# Patient Record
Sex: Male | Born: 1980 | Race: White | Hispanic: No | Marital: Single | Smoking: Current every day smoker
Health system: Southern US, Community
[De-identification: ages and names within clinical notes are randomized; demographics above are authoritative.]

## PROBLEM LIST (undated history)

## (undated) DIAGNOSIS — F909 Attention-deficit hyperactivity disorder, unspecified type: Secondary | ICD-10-CM

---

## 2005-09-02 ENCOUNTER — Emergency Department (HOSPITAL_COMMUNITY): Admission: EM | Admit: 2005-09-02 | Discharge: 2005-09-02 | Payer: Self-pay | Admitting: Emergency Medicine

## 2006-10-19 ENCOUNTER — Emergency Department (HOSPITAL_COMMUNITY): Admission: EM | Admit: 2006-10-19 | Discharge: 2006-10-19 | Payer: Self-pay | Admitting: *Deleted

## 2007-12-24 ENCOUNTER — Encounter: Admission: RE | Admit: 2007-12-24 | Discharge: 2007-12-24 | Payer: Self-pay | Admitting: Gastroenterology

## 2010-10-05 ENCOUNTER — Other Ambulatory Visit: Payer: Self-pay | Admitting: Family Medicine

## 2010-10-05 DIAGNOSIS — R9389 Abnormal findings on diagnostic imaging of other specified body structures: Secondary | ICD-10-CM

## 2010-10-06 ENCOUNTER — Inpatient Hospital Stay: Admission: RE | Admit: 2010-10-06 | Payer: Self-pay | Source: Ambulatory Visit

## 2016-09-20 ENCOUNTER — Ambulatory Visit (INDEPENDENT_AMBULATORY_CARE_PROVIDER_SITE_OTHER): Payer: Self-pay | Admitting: Physician Assistant

## 2016-09-20 VITALS — BP 131/91 | HR 100 | Temp 98.5°F | Resp 18 | Wt 197.0 lb

## 2016-09-20 DIAGNOSIS — F900 Attention-deficit hyperactivity disorder, predominantly inattentive type: Secondary | ICD-10-CM

## 2016-09-20 DIAGNOSIS — F419 Anxiety disorder, unspecified: Secondary | ICD-10-CM

## 2016-09-20 MED ORDER — AMPHETAMINE-DEXTROAMPHET ER 30 MG PO CP24: 30.0000 mg | ORAL_CAPSULE | Freq: Two times a day (BID) | ORAL | 0 refills | Status: DC

## 2016-09-20 NOTE — Progress Notes (Signed)
   George BallerJeffrey Archer  MRN: [id number] DOB: 12/14/1980  PCP: No PCP Per Patient  Chief Complaint  Patient presents with  . Medication Refill    Adderall     Subjective:  Pt presents to clinic for medication evaluation.  He has been seeing Fr[facility].  He has been seeing Rolm Galarik for recent increase in anxiety which has been related to inattentiveness and lack of focus with a new diagnosis of ADHD.  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  He is currently not in school but he only has a few classes to finish but he cannot get his work completed.  In both school and his private life he has starts many tasks but he finishes few to none of then.  He has a long list of things that need to be done but that gets him overwhelmed which increased his anxiety and the results is nothing gets done and he is anxious.  He pays bills right on time or late.  Since his diagnosis he has used a friends Adderall XR which has helped his anxiety and he has been able to finish tasks.  It helps for about 6 hours and gives him no side effects.   Franchot ErichsenErik nelson - for anxiety and depression  Review of Systems  Psychiatric/Behavioral: Positive for decreased concentration. Negative for dysphoric mood, self-injury and sleep disturbance. The patient is nervous/anxious.     There are no active problems to display for this patient.   No current outpatient prescriptions on file prior to visit.   No current facility-administered medications on file prior to visit.     No Known Allergies  Pt patients past, family and social history were reviewed and updated.   Objective:  BP (!) 131/91   Pulse 100   Temp 98.5 F (36.9 C) (Oral)   Resp 18   Wt 197 lb (89.4 kg)   SpO2 99%   Physical Exam  Constitutional: He is oriented to person, place, and time and well-developed, well-nourished, and in no distress.  HENT:  Head: Normocephalic and atraumatic.  Right Ear: External ear normal.  Left  Ear: External ear normal.  Eyes: Conjunctivae are normal.  Neck: Normal range of motion.  Cardiovascular: Normal rate, regular rhythm and normal heart sounds.   No murmur heard. Pulmonary/Chest: Effort normal and breath sounds normal. He has no wheezes.  Neurological: He is alert and oriented to person, place, and time. Gait normal.  Skin: Skin is warm and dry.  Psychiatric: Mood, memory, affect and judgment normal.  Restless in the room - constantly moving    Assessment and Plan :   Problem List Items Addressed This Visit      Other   Attention deficit hyperactivity disorder (ADHD), predominantly inattentive type - Primary    New Diagnosis.  Start Adderall XR 30mg  as the patient has tried this and it helps with his symptoms.  Continue seeing Franchot Erichsenrik Nelson for continued therapy.      Relevant Medications   amphetamine-dextroamphetamine (ADDERALL XR) 30 MG 24 hr capsule    Other Visit Diagnoses    Anxiety    Results from untreated ADHD - improved with treatment - this should continue to improve        Benny LennertSarah Levora Werden PA-C  Primary Care at [facility] 09/20/2016 2:06 PM

## 2016-09-20 NOTE — Patient Instructions (Addendum)
mychart message me in a month - with your progress      IF you received an x-ray today, you will receive an invoice from [facility]. Please contact [facility] at [phone] with questions or concerns regarding your invoice.   IF you received labwork today, you will receive an invoice from [facility]. Please contact [facility] at [phone] with questions or concerns regarding your invoice.   Our billing staff will not be able to assist you with questions regarding bills from these companies.  You will be contacted with the lab results as soon as they are available. The fastest way to get your results is to activate your My Chart account. Instructions are located on the last page of this paperwork. If you have not heard from us regarding the results in 2 weeks, please contact this office.

## 2016-09-30 ENCOUNTER — Encounter: Payer: Self-pay | Admitting: Physician Assistant

## 2016-09-30 DIAGNOSIS — F900 Attention-deficit hyperactivity disorder, predominantly inattentive type: Secondary | ICD-10-CM | POA: Insufficient documentation

## 2016-09-30 NOTE — Assessment & Plan Note (Signed)
New Diagnosis.  Start Adderall XR  as the patient has tried this and it helps with his symptoms.  Continue seeing Franchot Erichsen for continued therapy.

## 2016-10-21 ENCOUNTER — Encounter: Payer: Self-pay | Admitting: Physician Assistant

## 2016-10-21 DIAGNOSIS — F900 Attention-deficit hyperactivity disorder, predominantly inattentive type: Secondary | ICD-10-CM

## 2016-10-23 MED ORDER — AMPHETAMINE-DEXTROAMPHET ER 30 MG PO CP24: 30.0000 mg | ORAL_CAPSULE | Freq: Two times a day (BID) | ORAL | 0 refills | Status: DC

## 2016-10-23 NOTE — Telephone Encounter (Signed)
Done - pt knows through my chart 

## 2016-11-22 ENCOUNTER — Encounter: Payer: Self-pay | Admitting: Physician Assistant

## 2016-11-22 DIAGNOSIS — F900 Attention-deficit hyperactivity disorder, predominantly inattentive type: Secondary | ICD-10-CM

## 2016-11-23 MED ORDER — AMPHETAMINE-DEXTROAMPHET ER 30 MG PO CP24: 30.0000 mg | ORAL_CAPSULE | Freq: Two times a day (BID) | ORAL | 0 refills | Status: DC

## 2016-11-23 NOTE — Telephone Encounter (Signed)
Done - pt knows through my chart 

## 2016-12-23 ENCOUNTER — Emergency Department (HOSPITAL_COMMUNITY): Payer: Self-pay

## 2016-12-23 ENCOUNTER — Emergency Department (HOSPITAL_COMMUNITY)
Admission: EM | Admit: 2016-12-23 | Discharge: 2016-12-23 | Disposition: A | Discharge status: Home / Self Care | Payer: Self-pay | Attending: Emergency Medicine | Admitting: Emergency Medicine

## 2016-12-23 ENCOUNTER — Encounter (HOSPITAL_COMMUNITY): Payer: Self-pay | Admitting: *Deleted

## 2016-12-23 DIAGNOSIS — S52501A Unspecified fracture of the lower end of right radius, initial encounter for closed fracture: Secondary | ICD-10-CM | POA: Insufficient documentation

## 2016-12-23 DIAGNOSIS — Y9352 Activity, horseback riding: Secondary | ICD-10-CM | POA: Insufficient documentation

## 2016-12-23 DIAGNOSIS — Y998 Other external cause status: Secondary | ICD-10-CM | POA: Insufficient documentation

## 2016-12-23 DIAGNOSIS — M25551 Pain in right hip: Secondary | ICD-10-CM

## 2016-12-23 DIAGNOSIS — F900 Attention-deficit hyperactivity disorder, predominantly inattentive type: Secondary | ICD-10-CM | POA: Insufficient documentation

## 2016-12-23 DIAGNOSIS — F17210 Nicotine dependence, cigarettes, uncomplicated: Secondary | ICD-10-CM | POA: Insufficient documentation

## 2016-12-23 DIAGNOSIS — S3991XA Unspecified injury of abdomen, initial encounter: Secondary | ICD-10-CM | POA: Insufficient documentation

## 2016-12-23 DIAGNOSIS — Y929 Unspecified place or not applicable: Secondary | ICD-10-CM | POA: Insufficient documentation

## 2016-12-23 HISTORY — DX: Attention-deficit hyperactivity disorder, unspecified type: F90.9

## 2016-12-23 LAB — I-STAT CHEM 8, ED
BUN: 16 mg/dL (ref 6–20)
CALCIUM ION: 1.13 mmol/L — AB (ref 1.15–1.40)
CHLORIDE: 105 mmol/L (ref 101–111)
CREATININE: 1.2 mg/dL (ref 0.61–1.24)
Glucose, Bld: 104 mg/dL — ABNORMAL HIGH (ref 65–99)
HCT: 44 % (ref 39.0–52.0)
Hemoglobin: 15 g/dL (ref 13.0–17.0)
Potassium: 3.9 mmol/L (ref 3.5–5.1)
Sodium: 143 mmol/L (ref 135–145)
TCO2: 27 mmol/L (ref 0–100)

## 2016-12-23 MED ORDER — METHOCARBAMOL 500 MG PO TABS: 500.0000 mg | ORAL_TABLET | Freq: Two times a day (BID) | ORAL | 0 refills | Status: AC

## 2016-12-23 MED ORDER — METHOCARBAMOL 500 MG PO TABS
500.0000 mg | ORAL_TABLET | Freq: Once | ORAL | Status: AC
  Administered 2016-12-23: 500 mg via ORAL
  Filled 2016-12-23: qty 1

## 2016-12-23 MED ORDER — OXYCODONE-ACETAMINOPHEN 5-325 MG PO TABS: 1.0000 | ORAL_TABLET | ORAL | 0 refills | Status: AC | PRN

## 2016-12-23 MED ORDER — IOPAMIDOL (ISOVUE-300) INJECTION 61%
INTRAVENOUS | Status: AC
  Administered 2016-12-23: 100 mL
  Filled 2016-12-23: qty 100

## 2016-12-23 MED ORDER — OXYCODONE-ACETAMINOPHEN 5-325 MG PO TABS
1.0000 | ORAL_TABLET | Freq: Once | ORAL | Status: AC
  Administered 2016-12-23: 1 via ORAL
  Filled 2016-12-23: qty 1

## 2016-12-23 MED ORDER — LIDOCAINE HCL 2 % IJ SOLN
20.0000 mL | Freq: Once | INTRAMUSCULAR | Status: AC
  Administered 2016-12-23: 400 mg
  Filled 2016-12-23: qty 20

## 2016-12-23 NOTE — ED Notes (Signed)
Pt now agreeing to go for CT scans.

## 2016-12-23 NOTE — ED Notes (Signed)
Hand surgeon at bedside. 

## 2016-12-23 NOTE — Progress Notes (Signed)
Orthopedic Tech Progress Note Patient Details:  George BallerJeffrey Archer 05/30/1981 [id number]  Ortho Devices Type of Ortho Device: Arm sling, Ace wrap, Sugartong splint Ortho Device/Splint Interventions: Application   Saul FordyceJennifer C Gloyd Happ 12/23/2016, 8:43 AM

## 2016-12-23 NOTE — ED Notes (Signed)
Patient transported to CT 

## 2016-12-23 NOTE — ED Notes (Signed)
fluoro machine placed at bedside per dr Carlos LeveringGramig's request

## 2016-12-23 NOTE — ED Notes (Signed)
ED Provider at bedside. 

## 2016-12-23 NOTE — ED Notes (Signed)
Patient brought back from CT due to refusing his scan. Pt states he feels fine and does not want the CT scan. This RN explained the importance of the CT scan to patient who still refused. DR. Manus Gunningancour made aware.

## 2016-12-23 NOTE — ED Provider Notes (Signed)
Blood pressure 119/79, pulse (!) 103, temperature 98 F (36.7 C), temperature source Oral, resp. rate 18, height 6\' 4"  (1.93 m), weight 83.9 kg (185 lb), SpO2 100 %.  Assuming care from Dr. Manus Gunningancour.  In short, George Archer is a 36 y.o. male with a chief complaint of Fall (bucked from a horse) .  Refer to the original H&P for additional details.  The current plan of care is to f/u after wrist reduction.  09:31 AM Dr. Amanda PeaGramig reduced the wrist fracture. The patient is complaining of severe back pain. No fractures on CT. Plan for PO pain medication and ambulation prior to discharge.   11:00 AM Patient given additional medication but is asking for discharge despite having difficulty with ambulation. Discussed return precautions in detail.   At this time, I do not feel there is any life-threatening condition present. I have reviewed and discussed all results (EKG, imaging, lab, urine as appropriate), exam findings with patient. I have reviewed nursing notes and appropriate previous records.  I feel the patient is safe to be discharged home without further emergent workup. Discussed usual and customary return precautions. Patient and family (if present) verbalize understanding and are comfortable with this plan.  Patient will follow-up with their primary care provider. If they do not have a primary care provider, information for follow-up has been provided to them. All questions have been answered.  George BeneJoshua Jomari Bartnik, MD   George Archer, George Paulson G, MD 12/24/16 1125

## 2016-12-23 NOTE — Consult Note (Signed)
Reason for Consult:fracture left wrist Referring Physician: ER staff  George BallerJeffrey Archer is an 36 y.o. male.  HPI: Patient presents for evaluation of his left wrist fracture. He has a comminuted interarticular distal radius fracture. He notes no prior injury recently but did have 2 breaks when he was a child he states.  He is alert and appropriate. He's had out, folic beverages last night. He's had a thorough workup by the ER staff including CT of the chest abdomen and pelvis.  I've been asked to see and treat his left upper extremity. He is sensate in the painful he has obvious deformity. He is here today with significant other.  On examination he is alert and oriented and painful as mentioned.  Past Medical History:  Diagnosis Date  . ADHD     History reviewed. No pertinent surgical history.  No family history on file.  Social History:  reports that he has been smoking Cigarettes.  He has been smoking about 0.50 packs per day. He has never used smokeless tobacco. He reports that he drinks alcohol. He reports that he does not use drugs.  Allergies: No Known Allergies  Medications: I have reviewed the patient's current medications.  Results for orders placed or performed during the hospital encounter of 12/23/16 (from the past 48 hour(s))  I-stat chem 8, ed     Status: Abnormal   Collection Time: 12/23/16  4:55 AM  Result Value Ref Range   Sodium 143 135 - 145 mmol/L   Potassium 3.9 3.5 - 5.1 mmol/L   Chloride 105 101 - 111 mmol/L   BUN 16 6 - 20 mg/dL   Creatinine, Ser 1.611.20 0.61 - 1.24 mg/dL   Glucose, Bld 096104 (H) 65 - 99 mg/dL   Calcium, Ion 0.451.13 (L) 1.15 - 1.40 mmol/L   TCO2 27 0 - 100 mmol/L   Hemoglobin 15.0 13.0 - 17.0 g/dL   HCT 40.944.0 81.139.0 - 91.452.0 %    Dg Wrist Complete Left  Result Date: 12/23/2016 CLINICAL DATA:  Thrown from horse and CT. EXAM: LEFT WRIST - COMPLETE 3+ VIEW COMPARISON:  None. FINDINGS: There is a comminuted intra-articular distal radius fracture with  impaction and angulation. Ulnar styloid fracture. No dislocation. IMPRESSION: Fractures of the distal radius and ulnar styloid. Electronically Signed   By: Ellery Plunkaniel R Mitchell M.D.   On: 12/23/2016 04:55   Ct Head Wo Contrast  Result Date: 12/23/2016 CLINICAL DATA:  Larey SeatFell formal course with subsequent trauma to the right side and right hip. EXAM: CT HEAD WITHOUT CONTRAST TECHNIQUE: Contiguous axial images were obtained from the base of the skull through the vertex without intravenous contrast. COMPARISON:  None. FINDINGS: Brain: No evidence of malformation, atrophy, old or acute small or large vessel infarction, mass lesion, hemorrhage, hydrocephalus or extra-axial collection. No evidence of pituitary lesion. Vascular: No vascular calcification.  No hyperdense vessels. Skull: Normal.  No fracture or focal bone lesion. Sinuses/Orbits: Visualized sinuses are clear. No fluid in the middle ears or mastoids. Visualized orbits are normal. Other: None significant IMPRESSION: Normal head CT Electronically Signed   By: Paulina FusiMark  Shogry M.D.   On: 12/23/2016 07:08   Ct Cervical Spine Wo Contrast  Result Date: 12/23/2016 CLINICAL DATA:  Larey SeatFell from horse.  Trauma to the head and neck EXAM: CT CERVICAL SPINE WITHOUT CONTRAST TECHNIQUE: Multidetector CT imaging of the cervical spine was performed without intravenous contrast. Multiplanar CT image reconstructions were also generated. COMPARISON:  None. FINDINGS: Alignment: Normal Skull base and vertebrae: Normal Soft  tissues and spinal canal: Normal Disc levels: Normal except for very minimal uncovertebral hypertrophy at C3-4 and C4-5. Upper chest: Normal Other: None IMPRESSION: Normal CT scan cervical spine Electronically Signed   By: Paulina Fusi M.D.   On: 12/23/2016 07:12   Ct Abdomen Pelvis W Contrast  Result Date: 12/23/2016 CLINICAL DATA:  Larey Seat formal course. Subsequent trauma to the right abdomen and hip. EXAM: CT ABDOMEN AND PELVIS WITH CONTRAST TECHNIQUE: Multidetector  CT imaging of the abdomen and pelvis was performed using the standard protocol following bolus administration of intravenous contrast. CONTRAST:  ISOVUE-300 IOPAMIDOL (ISOVUE-300) INJECTION 61% COMPARISON:  Plain radiography same day FINDINGS: Lower chest: Normal Hepatobiliary: Normal Pancreas: Normal Spleen: Normal Adrenals/Urinary Tract: Adrenal glands are normal. Kidneys are normal. No evidence of bladder injury. Stomach/Bowel: Normal Vascular/Lymphatic: Normal Reproductive: Normal Other: No free fluid or air. Musculoskeletal: No evidence fracture or dislocation. Specific attention to the pelvis and right hip does not show any evidence of injury. IMPRESSION: Normal CT scan of the abdomen and pelvis. No traumatic finding. No cause of right hip region pain identified. Electronically Signed   By: Paulina Fusi M.D.   On: 12/23/2016 07:17   Dg Hip Unilat  With Pelvis 2-3 Views Right  Result Date: 12/23/2016 CLINICAL DATA:  Thrown from horse and kicked. EXAM: DG HIP (WITH OR WITHOUT PELVIS) 2-3V RIGHT COMPARISON:  None. FINDINGS: There is no evidence of hip fracture or dislocation. There is no evidence of arthropathy or other focal bone abnormality. IMPRESSION: Negative. Electronically Signed   By: Ellery Plunk M.D.   On: 12/23/2016 04:54    Review of Systems  Respiratory: Negative.   Cardiovascular: Negative.   Gastrointestinal: Negative.   Psychiatric/Behavioral: The patient is nervous/anxious.    Blood pressure 119/79, pulse (!) 103, temperature 98 F (36.7 C), temperature source Oral, resp. rate 18, height 6\' 4"  (1.93 m), weight 83.9 kg (185 lb), SpO2 100 %. Physical Exam Right upper extremity has IV access and is neurovascularly intact. Left upper extremity has a normal elbow shoulder and upper arm he has obvious deformity and pain with distal radius fracture about the left wrist. He is sensate.  There is no signs of compartment syndrome. X-rays are reviewed which show a comminuted  distal radius fracture is noted.  He complains of back pain. He moves all extremities. Chest appears to be clear and his abdomen is flat and not particularly tender.  Once again I reviewed his medical records chart and his findings. In regards to his left upper extremity I would recommend close reduction and he consents. Assessment/Plan: Comminuted left distal radius fracture.  I've recommended close reduction under hematoma block and he consents.  Patient was given a hematoma block with 15 mL of lidocaine without epinephrine. Following this we then performed close reduction of the left distal radius fracture. AP lateral and oblique views 4 were used to assess the reduction and look quite well.  I have recommended definitive operative fixation given the comminution and his parameters. Certainly we're able to give him a good-looking wrist today in terms of his radiographs however I feel he will have definitive progressive angulatory collapse and this will ultimately lead to a malunion to some degree. Thus I would recommend open reduction internal fixation.  I discussed all issues with the patient at length.  I would recommend follow-up in my office in 3-5 days elevate move massage fingers and notify should any problems occur. Tolerated the procedure quite well today and there no  complicating features.  Tracey Hermance III,Raeanne Deschler M 12/23/2016, 8:02 AM

## 2016-12-23 NOTE — ED Triage Notes (Signed)
Pt was intoxicated when he took a dare to jump on a horse.  Pt was then thrown from the horse and feels that he was struck by the hoof of the horse.  Pt denies any LOC, he reports pain in left wrist and right hip/buttock.  Pt is alert and oriented.  Obvious deformity of left wrist.

## 2016-12-23 NOTE — ED Notes (Signed)
Patient transported to X-ray 

## 2016-12-23 NOTE — Discharge Instructions (Addendum)
You were seen in the ED today after falling from a horse. You will need to see the orthopedic surgeon next week. Do not drive while taking these pain medication.   Return to the ED with any new or worsening pain.

## 2016-12-23 NOTE — ED Notes (Signed)
Patient requesting IV be taken out

## 2016-12-23 NOTE — ED Notes (Signed)
Pt stating he does not want to go for CT scans, pt states he does not need them and that he is "fine." Dr. Manus Gunningancour at bedside to explain importance of exam to patient who agreed to go for CT scan.

## 2016-12-23 NOTE — ED Notes (Signed)
Ortho tech called 

## 2016-12-23 NOTE — ED Notes (Addendum)
Attempted to ambulate patient. Patient unable to bear weight on right leg. EDP made aware

## 2016-12-23 NOTE — ED Provider Notes (Signed)
[facility]EMER[facility]ENCY DEPT Provider Note   CSN: [id number] Arrival date & time: 12/23/16  4098   By signing my name below, I, Clarisse Gouge, attest that this documentation has been prepared under the direction and in the presence of Biagio Snelson, Jeannett Senior, MD. Electronically signed, Clarisse Gouge, ED Scribe. 12/23/16. 4:05 AM.  History   Chief Complaint Chief Complaint  Patient presents with  . Fall    bucked from a horse   LEVEL 5 CAVEAT: HPI and ROS limited due to intoxication   The history is provided by the patient and medical records. The history is limited by the condition of the patient. No language interpreter was used.    George Archer is a 36 y.o. male with h/o ADHD presenting to the Emergency Department concerning a multiple arthralgias s/p fall that occurred 1.5 hours prior to evaluation. Pt states he was bucked by a horse after trying to jump on it and landed on his L wrist. L wrist deformity noted; he also notes posterior R hip and bilateral knee pains. He thinks he may have been kicked by the horse on the aforementioned hip and R flank. Abrasion noted to R hip. 10/10 No head trauma/ LOC noted. ETOH on board. No illicit drug use. No headache, neck pain, abdominal pain or back pains noted. Last tetanus unknown.  Past Medical History:  Diagnosis Date  . ADHD     Patient Active Problem List   Diagnosis Date Noted  . Attention deficit hyperactivity disorder (ADHD), predominantly inattentive type 09/30/2016    History reviewed. No pertinent surgical history.     Home Medications    Prior to Admission medications   Medication Sig Start Date End Date Taking? Authorizing Provider  amphetamine-dextroamphetamine (ADDERALL XR) 30 MG 24 hr capsule Take 1 capsule (30 mg total) by mouth 2 (two) times daily. 11/23/16   Valarie Cones, Dema Severin, PA-C    Family History No family history on file.  Social History Social History  Substance Use Topics  . Smoking status: Current Every Day Smoker      Packs/day: 0.50    Types: Cigarettes  . Smokeless tobacco: Never Used  . Alcohol use Yes     Comment: 12 beers a week     Allergies   Patient has no known allergies.   Review of Systems Review of Systems  Unable to perform ROS: Other     Physical Exam Updated Vital Signs BP 125/70 (BP Location: Right Arm)   Pulse 85   Temp 98 F (36.7 C) (Oral)   Resp 16   Ht 6\' 4"  (1.93 m)   Wt 185 lb (83.9 kg)   SpO2 100%   BMI 22.52 kg/m   Physical Exam  Constitutional: He is oriented to person, place, and time. He appears well-developed and well-nourished. No distress.  intoxicated  HENT:  Head: Normocephalic and atraumatic.  Mouth/Throat: Oropharynx is clear and moist. No oropharyngeal exudate.  Eyes: Conjunctivae and EOM are normal. Pupils are equal, round, and reactive to light.  Neck: Normal range of motion. Neck supple.  No meningismus.  Cardiovascular: Normal rate, regular rhythm, normal heart sounds and intact distal pulses.   No murmur heard. Pulmonary/Chest: Effort normal and breath sounds normal. No respiratory distress.  Abdominal: Soft. There is no tenderness. There is no rebound and no guarding.  Musculoskeletal: Normal range of motion. He exhibits tenderness and deformity. He exhibits no edema.  Deformity L wrist. Radial pulses intact. Cardinal hand movement intact. No C, T or L spine  tenderness.  Neurological: He is alert and oriented to person, place, and time. No cranial nerve deficit. He exhibits normal muscle tone. Coordination normal.   5/5 strength throughout. CN 2-12 intact.Equal grip strength.   Skin: Skin is warm. Abrasion noted.  Abrasion R flank/ hip  Psychiatric: He has a normal mood and affect. His behavior is normal.  Nursing note and vitals reviewed.    ED Treatments / Results  DIAGNOSTIC STUDIES: Oxygen Saturation is 100% on RA, NL by my interpretation.    COORDINATION OF CARE: 3:59 AM-Discussed next steps with pt. Pt verbalized  understanding and is agreeable with the plan. Will order XR of L wrist.   Labs (all labs ordered are listed, but only abnormal results are displayed) Labs Reviewed  I-STAT CHEM 8, ED - Abnormal; Notable for the following:       Result Value   Glucose, Bld 104 (*)    Calcium, Ion 1.13 (*)    All other components within normal limits    EKG  EKG Interpretation None       Radiology Dg Wrist Complete Left  Result Date: 12/23/2016 CLINICAL DATA:  Thrown from horse and CT. EXAM: LEFT WRIST - COMPLETE 3+ VIEW COMPARISON:  None. FINDINGS: There is a comminuted intra-articular distal radius fracture with impaction and angulation. Ulnar styloid fracture. No dislocation. IMPRESSION: Fractures of the distal radius and ulnar styloid. Electronically Signed   By: Ellery Plunkaniel R Mitchell M.D.   On: 12/23/2016 04:55   Ct Head Wo Contrast  Result Date: 12/23/2016 CLINICAL DATA:  Larey SeatFell formal course with subsequent trauma to the right side and right hip. EXAM: CT HEAD WITHOUT CONTRAST TECHNIQUE: Contiguous axial images were obtained from the base of the skull through the vertex without intravenous contrast. COMPARISON:  None. FINDINGS: Brain: No evidence of malformation, atrophy, old or acute small or large vessel infarction, mass lesion, hemorrhage, hydrocephalus or extra-axial collection. No evidence of pituitary lesion. Vascular: No vascular calcification.  No hyperdense vessels. Skull: Normal.  No fracture or focal bone lesion. Sinuses/Orbits: Visualized sinuses are clear. No fluid in the middle ears or mastoids. Visualized orbits are normal. Other: None significant IMPRESSION: Normal head CT Electronically Signed   By: Paulina FusiMark  Shogry M.D.   On: 12/23/2016 07:08   Ct Cervical Spine Wo Contrast  Result Date: 12/23/2016 CLINICAL DATA:  Larey SeatFell from horse.  Trauma to the head and neck EXAM: CT CERVICAL SPINE WITHOUT CONTRAST TECHNIQUE: Multidetector CT imaging of the cervical spine was performed without intravenous  contrast. Multiplanar CT image reconstructions were also generated. COMPARISON:  None. FINDINGS: Alignment: Normal Skull base and vertebrae: Normal Soft tissues and spinal canal: Normal Disc levels: Normal except for very minimal uncovertebral hypertrophy at C3-4 and C4-5. Upper chest: Normal Other: None IMPRESSION: Normal CT scan cervical spine Electronically Signed   By: Paulina FusiMark  Shogry M.D.   On: 12/23/2016 07:12   Ct Abdomen Pelvis W Contrast  Result Date: 12/23/2016 CLINICAL DATA:  Larey SeatFell formal course. Subsequent trauma to the right abdomen and hip. EXAM: CT ABDOMEN AND PELVIS WITH CONTRAST TECHNIQUE: Multidetector CT imaging of the abdomen and pelvis was performed using the standard protocol following bolus administration of intravenous contrast. CONTRAST:  100mL ISOVUE-300 IOPAMIDOL (ISOVUE-300) INJECTION 61% COMPARISON:  Plain radiography same day FINDINGS: Lower chest: Normal Hepatobiliary: Normal Pancreas: Normal Spleen: Normal Adrenals/Urinary Tract: Adrenal glands are normal. Kidneys are normal. No evidence of bladder injury. Stomach/Bowel: Normal Vascular/Lymphatic: Normal Reproductive: Normal Other: No free fluid or air. Musculoskeletal: No evidence fracture  or dislocation. Specific attention to the pelvis and right hip does not show any evidence of injury. IMPRESSION: Normal CT scan of the abdomen and pelvis. No traumatic finding. No cause of right hip region pain identified. Electronically Signed   By: Paulina Fusi M.D.   On: 12/23/2016 07:17   Dg Hip Unilat  With Pelvis 2-3 Views Right  Result Date: 12/23/2016 CLINICAL DATA:  Thrown from horse and kicked. EXAM: DG HIP (WITH OR WITHOUT PELVIS) 2-3V RIGHT COMPARISON:  None. FINDINGS: There is no evidence of hip fracture or dislocation. There is no evidence of arthropathy or other focal bone abnormality. IMPRESSION: Negative. Electronically Signed   By: Ellery Plunk M.D.   On: 12/23/2016 04:54    Procedures Procedures (including critical  care time)  Medications Ordered in ED Medications - No data to display   Initial Impression / Assessment and Plan / ED Course  I have reviewed the triage vital signs and the nursing notes.  Pertinent labs & imaging results that were available during my care of the patient were reviewed by me and considered in my medical decision making (see chart for details).    Level 5 caveat for alcohol intoxication. Patient jumped onto a horse and was thrown off landing on his left wrist and right hip. Denies hitting head or losing consciousness.  Deformity to left wrist with pulse intact. No open wounds.  CT head obtained given intoxication and C a/p obtained given flank abrasion.  CTs negative. Patient refuses tetanus. Distal radius fracture and ulnar styloid fracture confirmed on Xray.  D/w Dr. Amanda Pea who will see in the ED and reduce and splint.  Dr. Jacqulyn Bath aware of patient at shift change.  Final Clinical Impressions(s) / ED Diagnoses   Final diagnoses:  Closed fracture of distal end of right radius, unspecified fracture morphology, initial encounter  Fall from horse, initial encounter    New Prescriptions New Prescriptions   No medications on file   I personally performed the services described in this documentation, which was scribed in my presence. The recorded information has been reviewed and is accurate.    Glynn Octave, MD 12/23/16 [phone]

## 2016-12-25 ENCOUNTER — Ambulatory Visit: Payer: Self-pay | Admitting: Physician Assistant

## 2016-12-25 ENCOUNTER — Encounter: Payer: Self-pay | Admitting: Physician Assistant

## 2016-12-28 ENCOUNTER — Ambulatory Visit (INDEPENDENT_AMBULATORY_CARE_PROVIDER_SITE_OTHER): Payer: Self-pay | Admitting: Physician Assistant

## 2016-12-28 ENCOUNTER — Encounter: Payer: Self-pay | Admitting: Physician Assistant

## 2016-12-28 DIAGNOSIS — F900 Attention-deficit hyperactivity disorder, predominantly inattentive type: Secondary | ICD-10-CM

## 2016-12-28 MED ORDER — AMPHETAMINE-DEXTROAMPHET ER 30 MG PO CP24: 30.0000 mg | ORAL_CAPSULE | ORAL | 0 refills | Status: DC

## 2016-12-28 MED ORDER — AMPHETAMINE-DEXTROAMPHET ER 30 MG PO CP24: 30.0000 mg | ORAL_CAPSULE | Freq: Two times a day (BID) | ORAL | 0 refills | Status: DC

## 2016-12-28 MED ORDER — AMPHETAMINE-DEXTROAMPHET ER 30 MG PO CP24
30.0000 mg | ORAL_CAPSULE | ORAL | 0 refills | Status: DC
Start: 2016-12-28 — End: 2017-01-29

## 2016-12-28 NOTE — Patient Instructions (Signed)
     IF you received an x-ray today, you will receive an invoice from [facility]. Please contact [facility] at [phone] with questions or concerns regarding your invoice.   IF you received labwork today, you will receive an invoice from [facility]. Please contact [facility] at [phone] with questions or concerns regarding your invoice.   Our billing staff will not be able to assist you with questions regarding bills from these companies.  You will be contacted with the lab results as soon as they are available. The fastest way to get your results is to activate your My Chart account. Instructions are located on the last page of this paperwork. If you have not heard from us regarding the results in 2 weeks, please contact this office.     

## 2016-12-28 NOTE — Progress Notes (Signed)
   Sonia BallerJeffrey Rozar  MRN: [id number] DOB: 10/10/1980  PCP: Morrell RiddleWeber, Ariane Ditullio L, PA-C  Chief Complaint  Patient presents with  . Medication Refill    Adderall    Subjective:  Pt presents to clinic for medication refills.  He is doing so much better since the start of his medications.  His anxiety is almost gone.  Using Adderall - wakes around noon - next pills 6-7pm prior to work --   Still seeing Rolm Galarik for therapy  Recently broke his arm and he thinks he will likely have to have surgery - he has f/u with ortho scheduled for next week.  Review of Systems  Constitutional: Negative for appetite change, chills, fever and unexpected weight change.  Respiratory: Negative for cough.   Cardiovascular: Negative for chest pain and leg swelling.  Psychiatric/Behavioral: Negative for sleep disturbance.    Patient Active Problem List   Diagnosis Date Noted  . Attention deficit hyperactivity disorder (ADHD), predominantly inattentive type 09/30/2016    Current Outpatient Prescriptions on File Prior to Visit  Medication Sig Dispense Refill  . methocarbamol (ROBAXIN) 500 MG tablet Take 1 tablet (500 mg total) by mouth 2 (two) times daily. 20 tablet 0  . oxyCODONE-acetaminophen (PERCOCET/ROXICET) 5-325 MG tablet Take 1 tablet by mouth every 4 (four) hours as needed for severe pain. 20 tablet 0   No current facility-administered medications on file prior to visit.     No Known Allergies  Pt patients past, family and social history were reviewed and updated.   Objective:  BP 101/62 (BP Location: Right Arm, Patient Position: Sitting, Cuff Size: Normal)   Pulse 93   Temp (!) 97.4 F (36.3 C) (Oral)   Resp 18   Ht 6' 4.06" (1.932 m)   Wt 190 lb 12.8 oz (86.5 kg)   SpO2 98%   BMI 23.19 kg/m   Physical Exam  Constitutional: He is oriented to person, place, and time and well-developed, well-nourished, and in no distress.  HENT:  Head: Normocephalic and atraumatic.  Right Ear: External ear  normal.  Left Ear: External ear normal.  Eyes: Conjunctivae are normal.  Neck: Normal range of motion.  Pulmonary/Chest: Effort normal.  Neurological: He is alert and oriented to person, place, and time. Gait normal.  Skin: Skin is warm and dry.  Psychiatric: Mood, memory, affect and judgment normal.    Assessment and Plan :  Attention deficit hyperactivity disorder (ADHD), predominantly inattentive type - Plan: amphetamine-dextroamphetamine (ADDERALL XR) 30 MG 24 hr capsule, amphetamine-dextroamphetamine (ADDERALL XR) 30 MG 24 hr capsule, amphetamine-dextroamphetamine (ADDERALL XR) 30 MG 24 hr capsule   Wrote Rx for patient - gave him a 3 months supply - he understands that it is his responsibility that if he loses them.  Contact me in 3 months for the next set.  Benny LennertSarah Felicita Nuncio PA-C  [facility] 12/28/2016 9:58 AM

## 2017-01-29 ENCOUNTER — Other Ambulatory Visit: Payer: Self-pay | Admitting: Physician Assistant

## 2017-01-29 ENCOUNTER — Telehealth: Payer: Self-pay | Admitting: Physician Assistant

## 2017-01-29 DIAGNOSIS — F900 Attention-deficit hyperactivity disorder, predominantly inattentive type: Secondary | ICD-10-CM

## 2017-01-29 MED ORDER — AMPHETAMINE-DEXTROAMPHET ER 30 MG PO CP24: 30.0000 mg | ORAL_CAPSULE | Freq: Two times a day (BID) | ORAL | 0 refills | Status: DC

## 2017-01-29 NOTE — Telephone Encounter (Signed)
Talked to the pharmacy and he is going to put in a note but I told him if they needed to call us and get confirmation when pt goes to fill it that would be fine.

## 2017-01-29 NOTE — Telephone Encounter (Signed)
I accidental wrote his Rx incorrectly - He returned the incorrect Rx and I rewrote the other 2 correctly

## 2017-01-29 NOTE — Telephone Encounter (Signed)
An we please all pharmacy - The Rx he just filled I wrote incorrectly at qd dosing - he was instructed to take that Rx bid dosing due to my error - he has been given another Rx for #60 at bid dosing that he will bring to the pharmacy in 2 weeks.  I wanted them to know so they do not think that he is trying to fill them early.

## 2017-01-29 NOTE — Progress Notes (Unsigned)
I mis wrote his Rx.

## 2017-02-12 ENCOUNTER — Encounter: Payer: Self-pay | Admitting: Physician Assistant

## 2017-03-26 ENCOUNTER — Ambulatory Visit: Payer: Self-pay | Admitting: Physician Assistant

## 2017-04-29 ENCOUNTER — Encounter: Payer: Self-pay | Admitting: Physician Assistant

## 2017-04-29 DIAGNOSIS — F900 Attention-deficit hyperactivity disorder, predominantly inattentive type: Secondary | ICD-10-CM

## 2017-04-29 MED ORDER — AMPHETAMINE-DEXTROAMPHET ER 30 MG PO CP24: 30.0000 mg | ORAL_CAPSULE | Freq: Two times a day (BID) | ORAL | 0 refills | Status: AC

## 2017-04-29 MED ORDER — AMPHETAMINE-DEXTROAMPHET ER 30 MG PO CP24: 30.0000 mg | ORAL_CAPSULE | Freq: Two times a day (BID) | ORAL | 0 refills | Status: DC

## 2017-04-29 NOTE — Telephone Encounter (Signed)
Done.  [facility]

## 2017-05-08 ENCOUNTER — Encounter: Payer: Self-pay | Admitting: Physician Assistant

## 2017-06-04 ENCOUNTER — Encounter: Payer: Self-pay | Admitting: Physician Assistant

## 2017-06-04 DIAGNOSIS — F900 Attention-deficit hyperactivity disorder, predominantly inattentive type: Secondary | ICD-10-CM

## 2017-06-05 MED ORDER — AMPHETAMINE-DEXTROAMPHET ER 30 MG PO CP24: 30.0000 mg | ORAL_CAPSULE | Freq: Two times a day (BID) | ORAL | 0 refills | Status: DC

## 2017-06-05 NOTE — Telephone Encounter (Signed)
Dr. Katrinka BlazingSmith,  This is one of Sarah's regular patients, who is in [place] for rehabilitation for his wrist. [place] requires that a physician sign Adderall prescriptions.  I have pending the prescription, set to the [place], [place] pharmacy, if you would kindly sign it. I've verified with the pharmacy that they can receive the electronic prescription.  His last visit was in July, and he is to follow-up with her in January.

## 2017-07-03 ENCOUNTER — Encounter: Payer: Self-pay | Admitting: Physician Assistant

## 2017-07-03 DIAGNOSIS — F900 Attention-deficit hyperactivity disorder, predominantly inattentive type: Secondary | ICD-10-CM

## 2017-07-04 ENCOUNTER — Telehealth: Payer: Self-pay | Admitting: Physician Assistant

## 2017-07-04 NOTE — Telephone Encounter (Signed)
Copied from CRM [phone]. Topic: Quick Communication - See Telephone Encounter >> Jul 04, 2017 12:29 PM Arlyss Gandyichardson, Lamyah Creed N, NT wrote: CRM for notification. See Telephone encounter for: [facility] [place] is unable to accept the rx the pt dropped off for  amphetamine-dextroamphetamine (ADDERALL XR) 30 MG because it was signed by the PA. They want to see if a MD will be willing to send in electronically? CB#: [phone]  07/04/17.

## 2017-07-05 ENCOUNTER — Other Ambulatory Visit: Payer: Self-pay

## 2017-07-05 DIAGNOSIS — F900 Attention-deficit hyperactivity disorder, predominantly inattentive type: Secondary | ICD-10-CM

## 2017-07-05 MED ORDER — AMPHETAMINE-DEXTROAMPHET ER 30 MG PO CP24: 30.0000 mg | ORAL_CAPSULE | Freq: Two times a day (BID) | ORAL | 0 refills | Status: DC

## 2017-07-05 NOTE — Telephone Encounter (Signed)
Copied from CRM #[id number]. Topic: Quick Communication - See Telephone Encounter >> Jul 04, 2017 12:29 PM Richardson, Taren N, NT wrote: CRM for notification. See Telephone encounter for: George Archer from [facility] in [place] is unable to accept the rx the pt dropped off for  amphetamine-dextroamphetamine (ADDERALL XR) 30 MG because it was signed by the PA. They want to see if a MD will be willing to send in electronically? CB#: [phone]  07/04/17. 

## 2017-07-09 MED ORDER — AMPHETAMINE-DEXTROAMPHET ER 30 MG PO CP24: 30.0000 mg | ORAL_CAPSULE | Freq: Two times a day (BID) | ORAL | 0 refills | Status: AC

## 2017-07-09 NOTE — Telephone Encounter (Addendum)
Dr Neva SeatGreene - this is a patient of mine would you mind electronically sending in the 1 month of adderal that I pended.  The pharmacy is correct and it is in [place] which is why I cannot send it.  Thanks

## 2017-07-09 NOTE — Telephone Encounter (Signed)
One-month prescription sent to pharmacy in [place] electronically.

## 2017-07-09 NOTE — Addendum Note (Signed)
Addended by: Meredith StaggersGREENE, Kellyn R on: 07/09/2017 12:59 PM   Modules accepted: Orders

## 2017-07-09 NOTE — Addendum Note (Signed)
Addended by: Morrell RiddleWEBER, Deago Burruss L on: 07/09/2017 11:12 AM   Modules accepted: Orders

## 2017-07-09 NOTE — Telephone Encounter (Signed)
It is time for the patient to have an OV.  Please have him make an appt.

## 2018-05-27 IMAGING — CR DG HIP (WITH OR WITHOUT PELVIS) 2-3V*R*
3 series · 3 of 3 positions shown · non-contrast
Comparison: None.

CLINICAL DATA: Thrown from horse and kicked.

EXAM:
DG HIP (WITH OR WITHOUT PELVIS) 2-3V RIGHT

[pelvis ap]
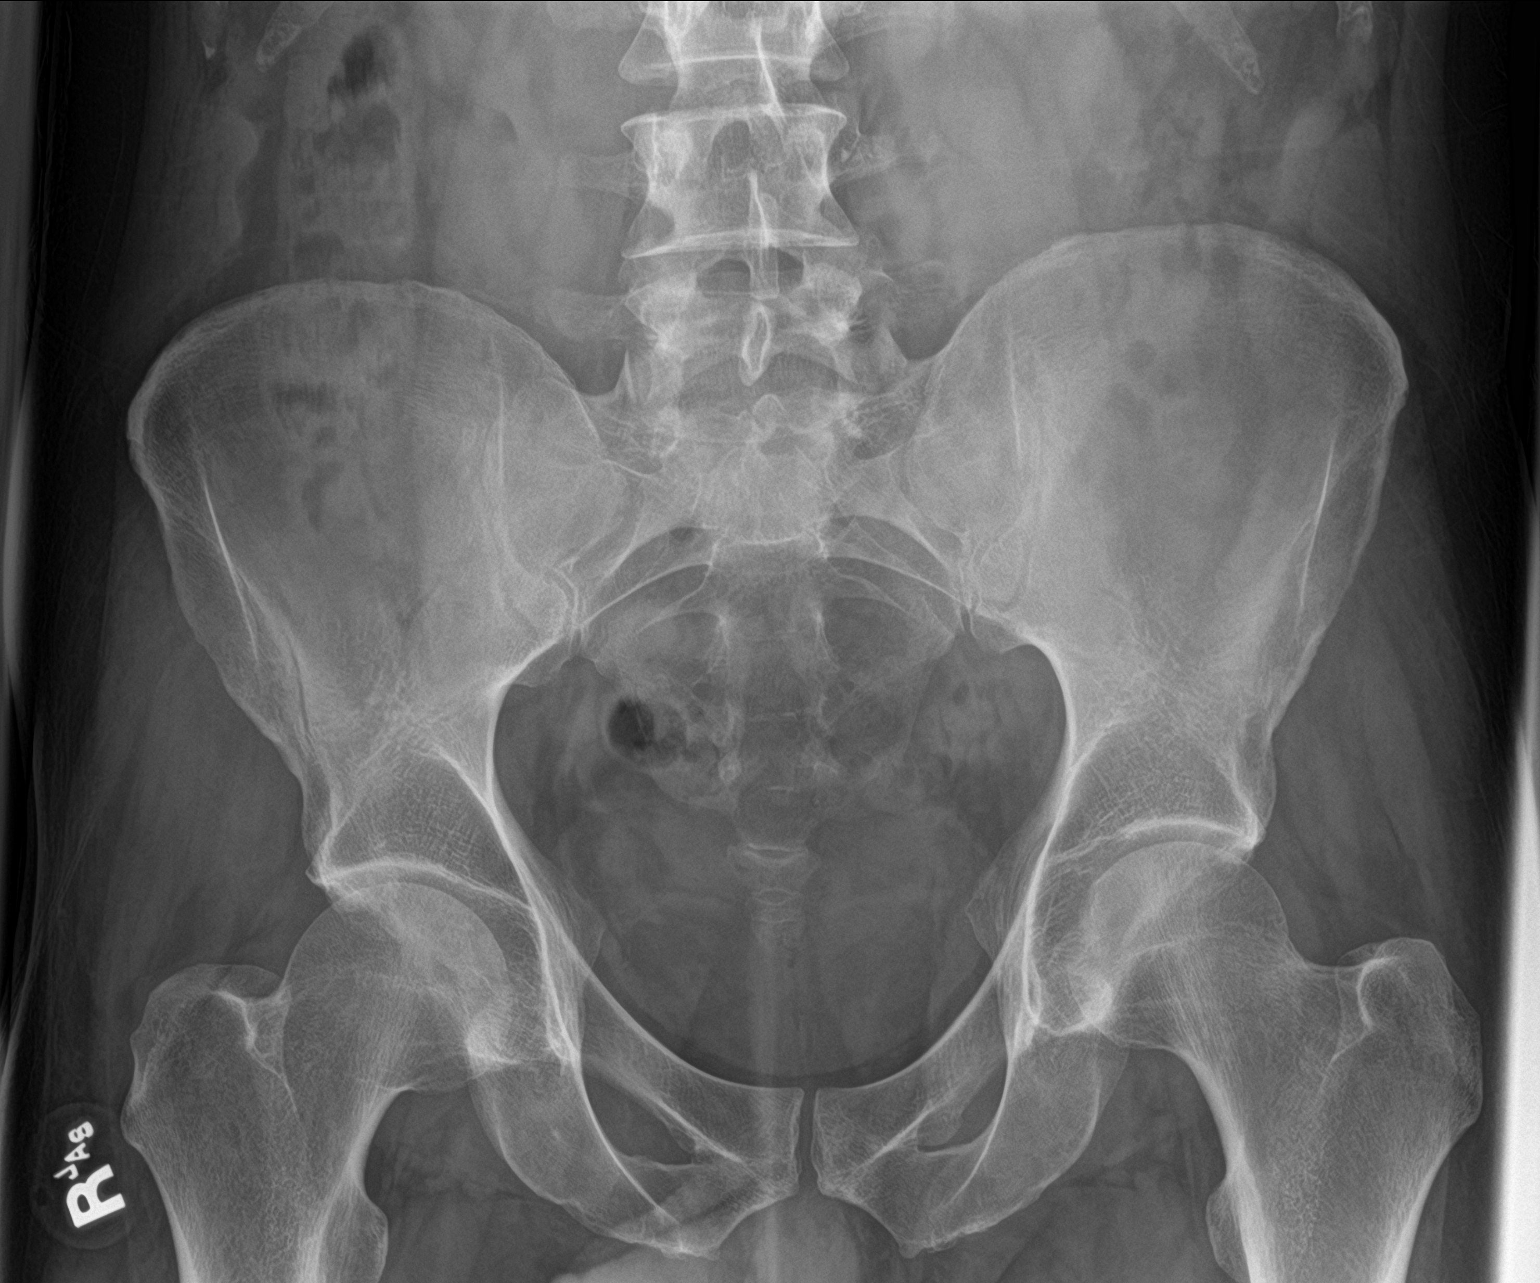

[hip ap]
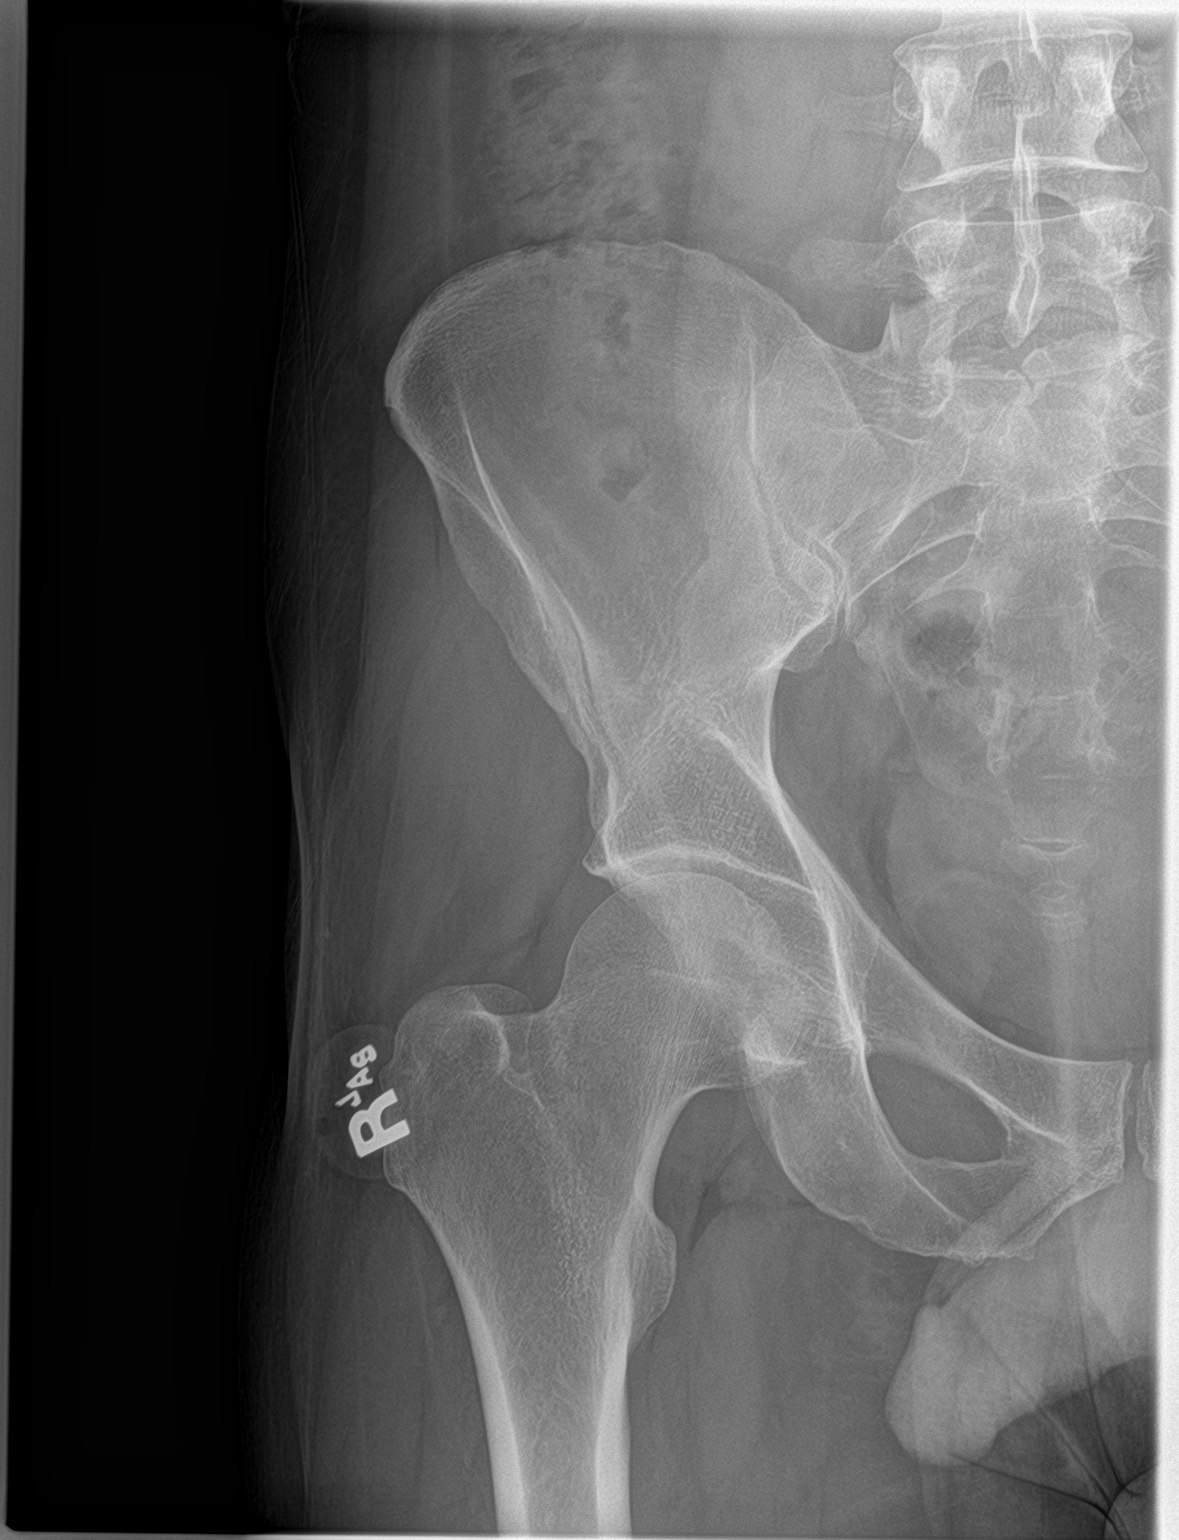

[hip lat]
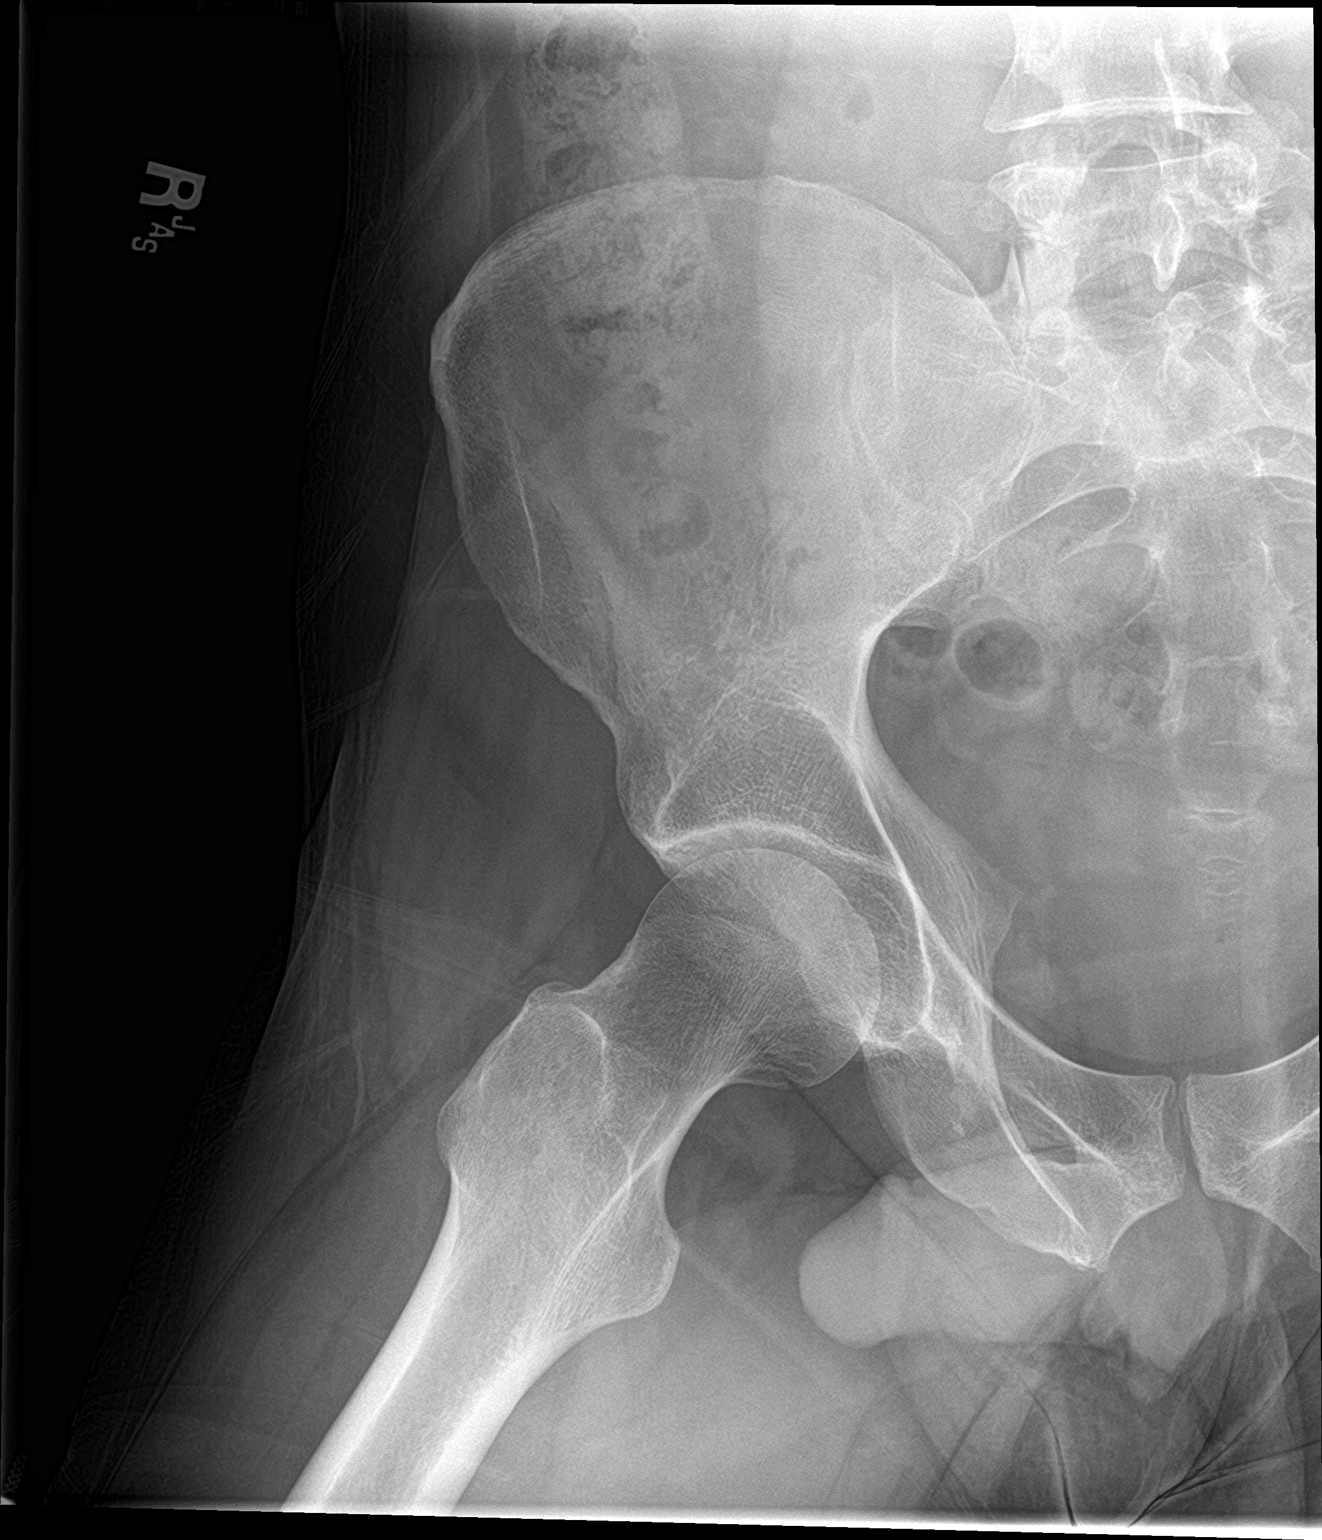

[3 of 3 positions shown; findings below may reference images not displayed]

FINDINGS: There is no evidence of hip fracture or dislocation. There is no
evidence of arthropathy or other focal bone abnormality.
IMPRESSION: Negative.

## 2018-05-27 IMAGING — CT CT HEAD W/O CM
5 of 12 series · 15 of 47 positions shown, 17 images · non-contrast
Comparison: None.

CLINICAL DATA: Fell formal course with subsequent trauma to the
right side and right hip.

EXAM:
CT HEAD WITHOUT CONTRAST
TECHNIQUE: Contiguous axial images were obtained from the base of the skull
through the vertex without intravenous contrast.

[Series 3: head bone · axial · 0.42mm/px · z∈[-140,-38]mm · 4 of 86 slices shown (1 of 2)]
[im 18/86  bone]
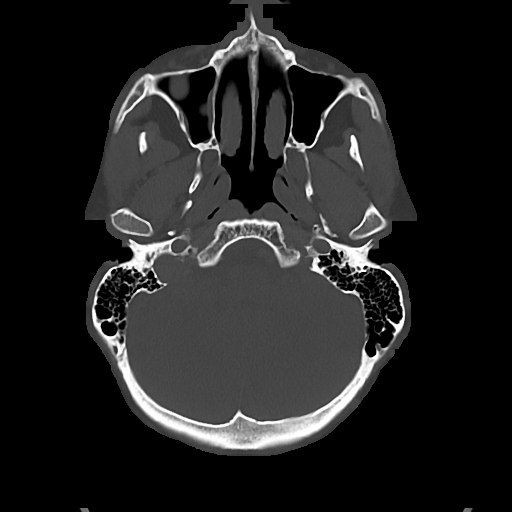
[im 35/86  bone]
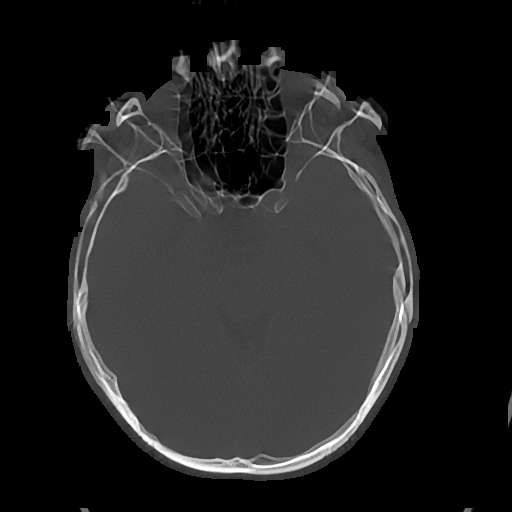
[im 52/86  bone]
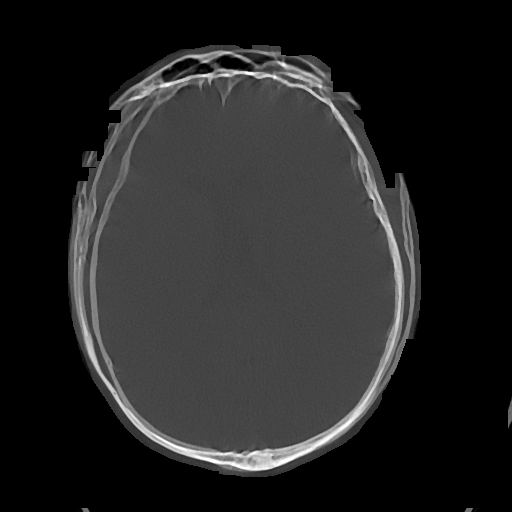
[im 69/86  bone]
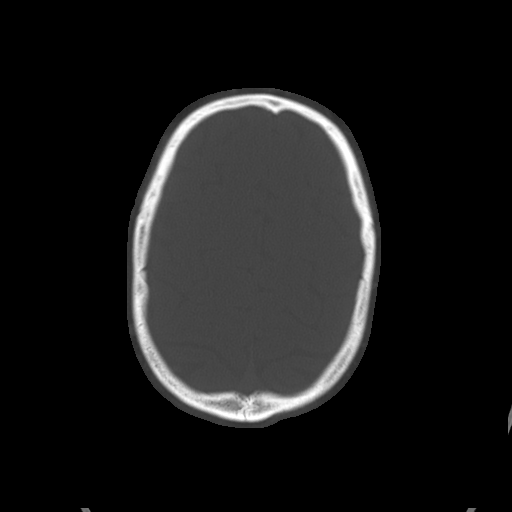

[Series 10: sag bone · sagittal · 0.30mm/px · 1 of 61 slices shown]
[im 31/61  brain]
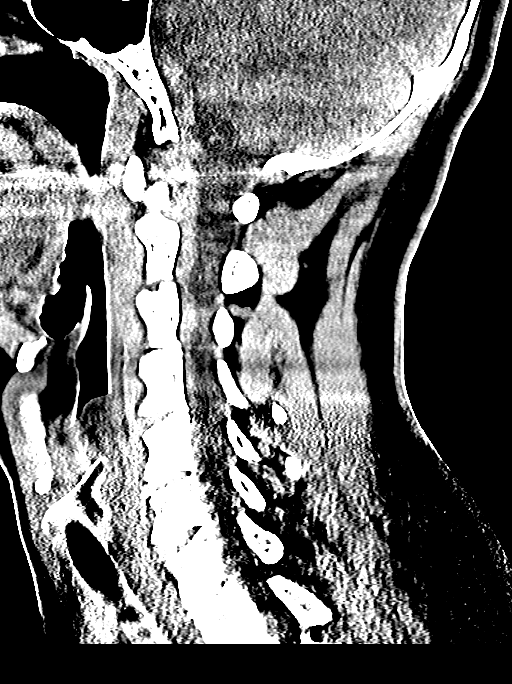

[Series 12: orthogonal axials · axial · 0.21mm/px · z∈[-297,-180]mm · 5 of 83 slices shown, 7 images]
[im 14/83  brain]
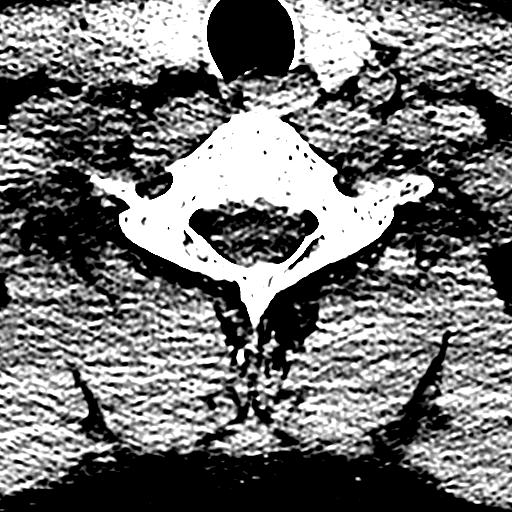
[im 14/83  bone]
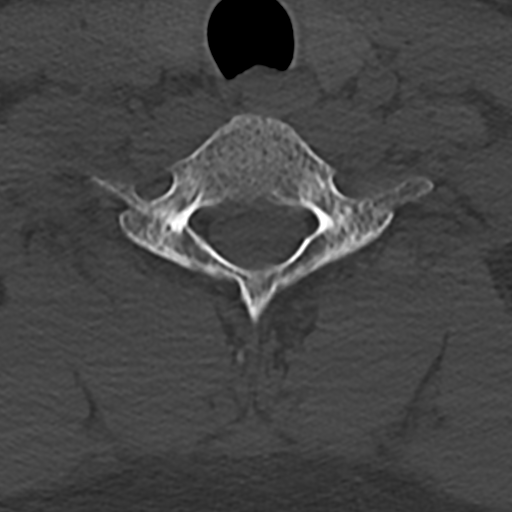
[im 28/83  brain]
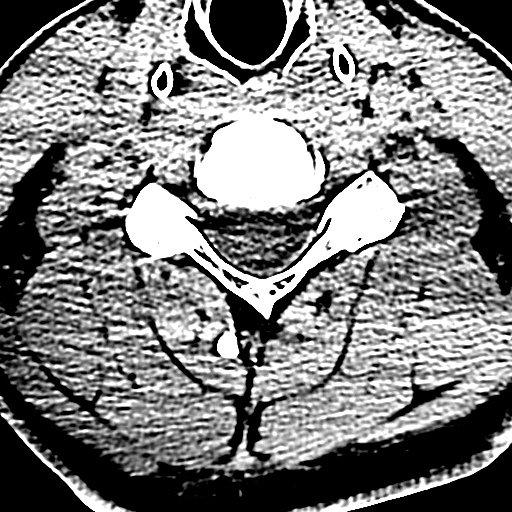
[im 42/83  brain]
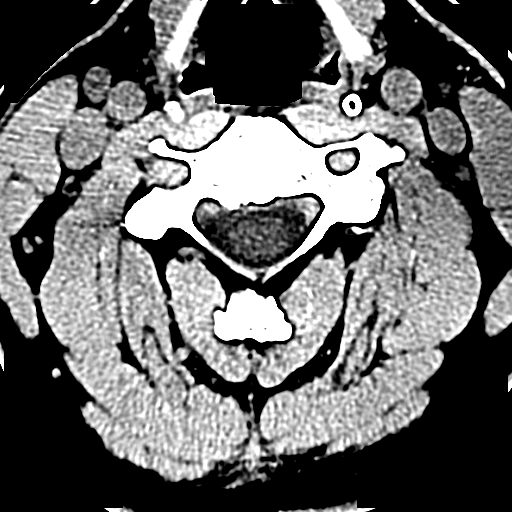
[im 55/83  brain]
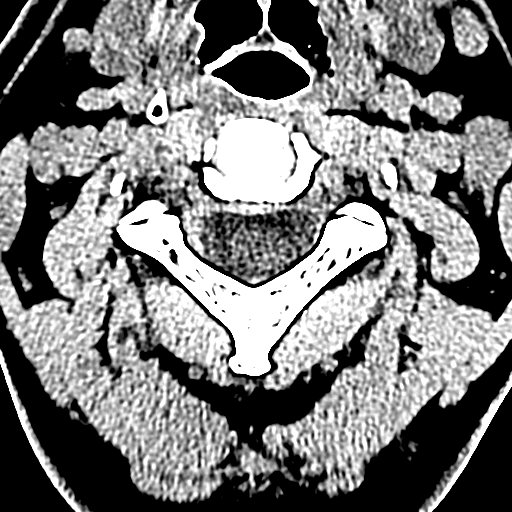
[im 69/83  brain]
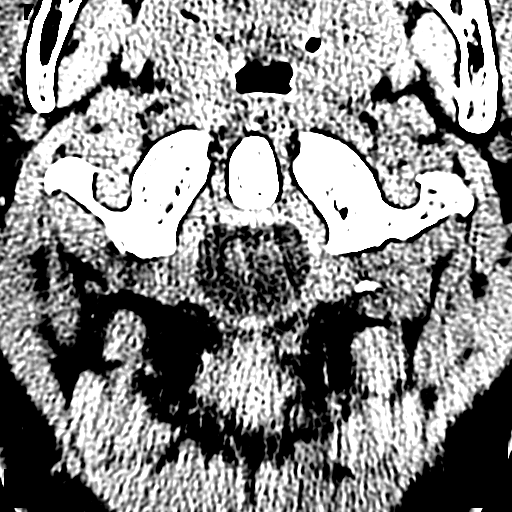
[im 69/83  bone]
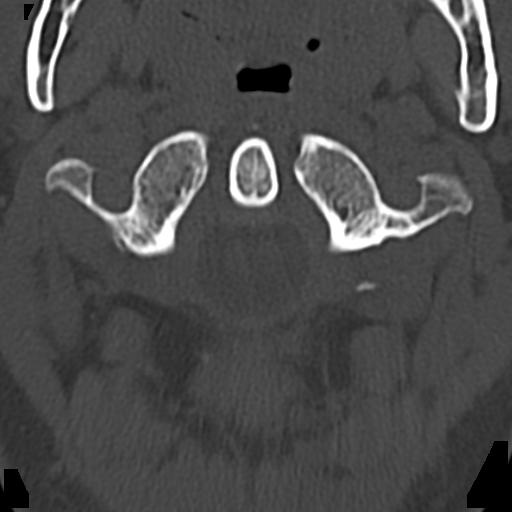

[Series 14: head bone · axial · 0.42mm/px · z∈[-100,-70]mm · 2 of 46 slices shown (2 of 2)]
[im 16/46  bone]
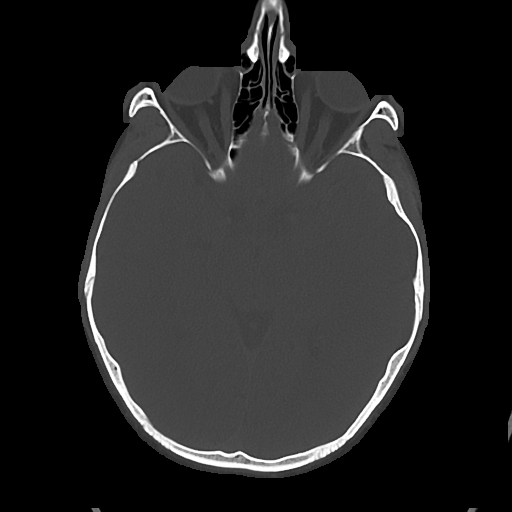
[im 31/46  bone]
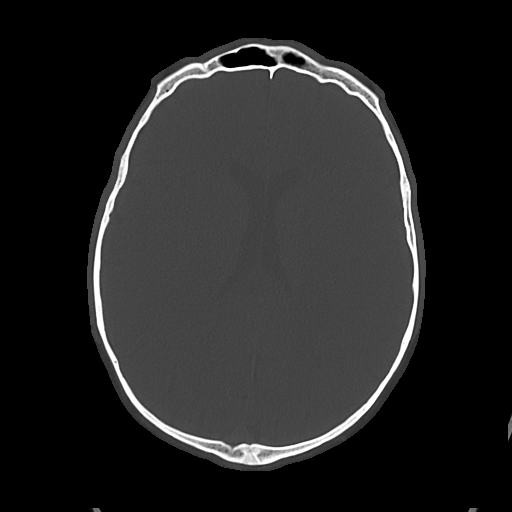

[Series 15: cor soft · coronal · 0.20mm/px · 3 of 67 slices shown]
[im 17/67  brain]
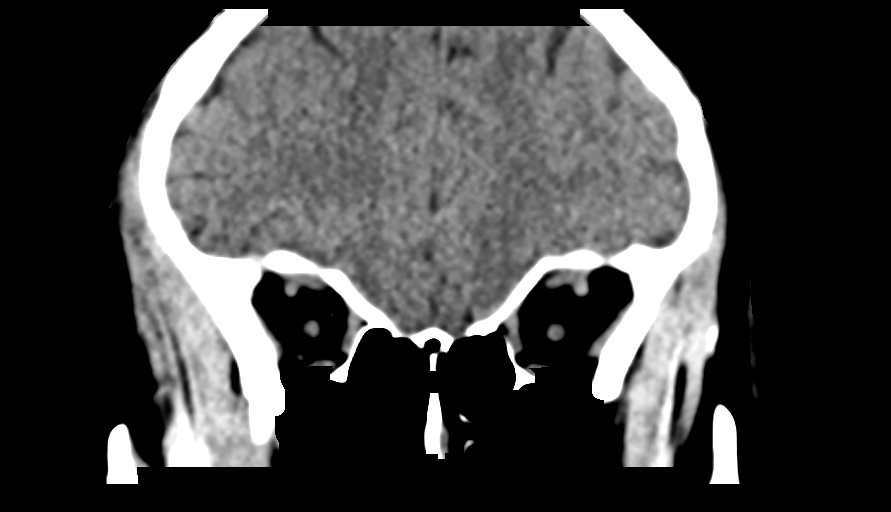
[im 34/67  brain]
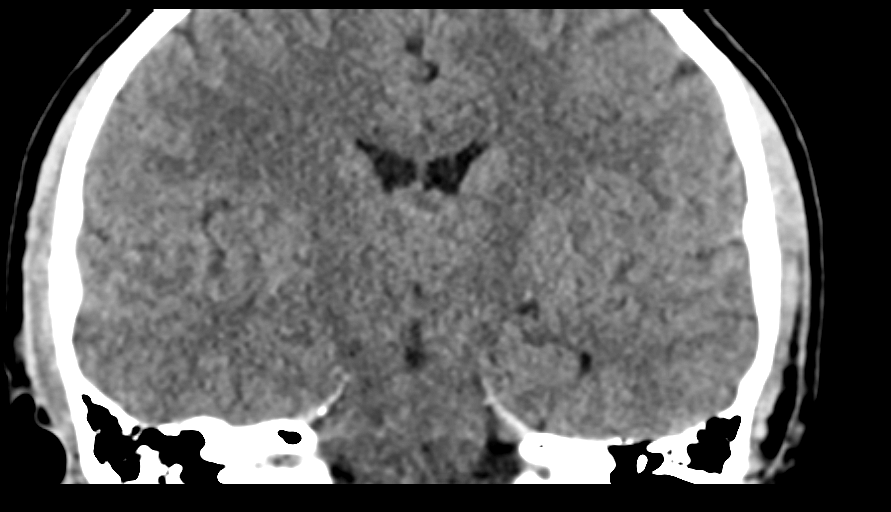
[im 50/67  brain]
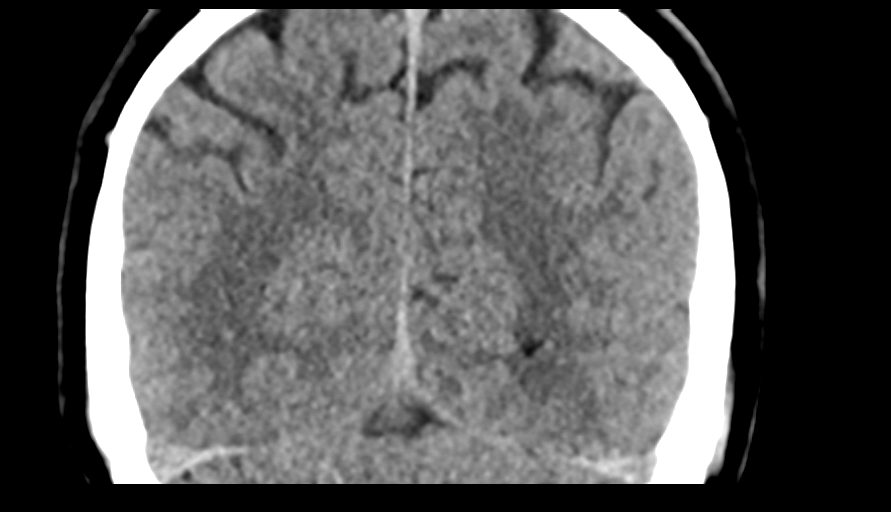

[15 of 47 positions shown; findings below may reference images not displayed]

FINDINGS: Brain: No evidence of malformation, atrophy, old or acute small or
large vessel infarction, mass lesion, hemorrhage, hydrocephalus or
extra-axial collection. No evidence of pituitary lesion.

Vascular: No vascular calcification.  No hyperdense vessels.

Skull: Normal.  No fracture or focal bone lesion.

Sinuses/Orbits: Visualized sinuses are clear. No fluid in the middle
ears or mastoids. Visualized orbits are normal.

Other: None significant
IMPRESSION: Normal head CT

## 2018-05-27 IMAGING — CT CT ABD-PELV W/ CM
2 of 5 series · 16 of 46 positions shown, 18 images · IV contrast (APPLIED)
Comparison: Plain radiography same day

CLINICAL DATA: Fell formal course. Subsequent trauma to the right
abdomen and hip.

EXAM:
CT ABDOMEN AND PELVIS WITH CONTRAST
TECHNIQUE: Multidetector CT imaging of the abdomen and pelvis was performed
using the standard protocol following bolus administration of
intravenous contrast.
CONTRAST:  100mL [id number]-FNN IOPAMIDOL ([id number]-FNN) INJECTION 61%

[Series 3: abdomen 5.0 · axial · 0.74mm/px · z∈[-1011,-526]mm · 13 of 111 slices shown, 15 images]
[im 7/111  soft-tissue]
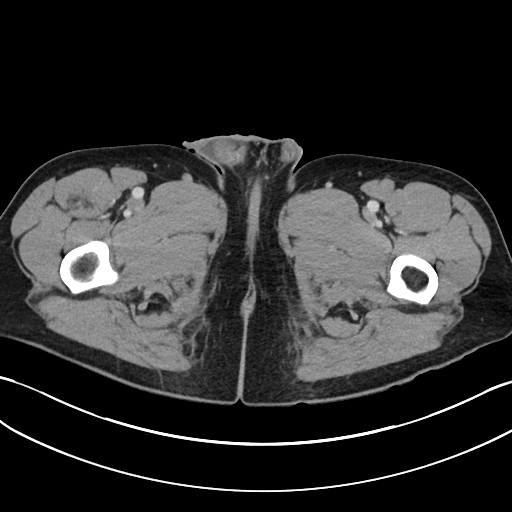
[im 7/111  bone]
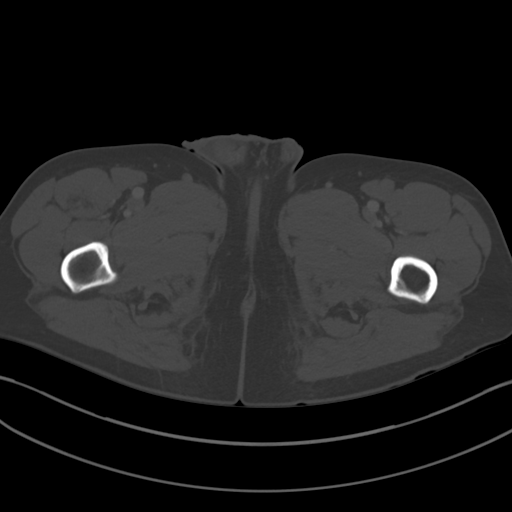
[im 14/111  soft-tissue]
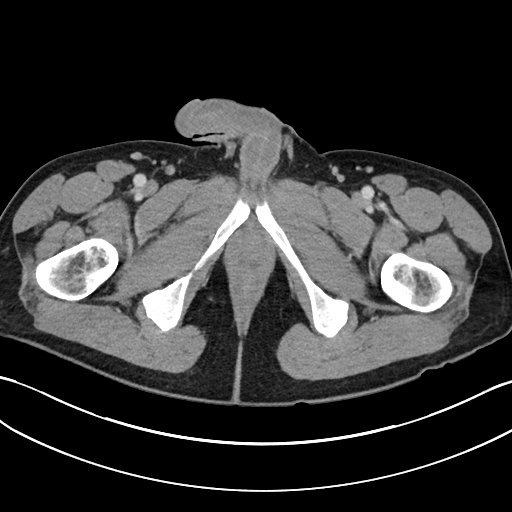
[im 21/111  soft-tissue]
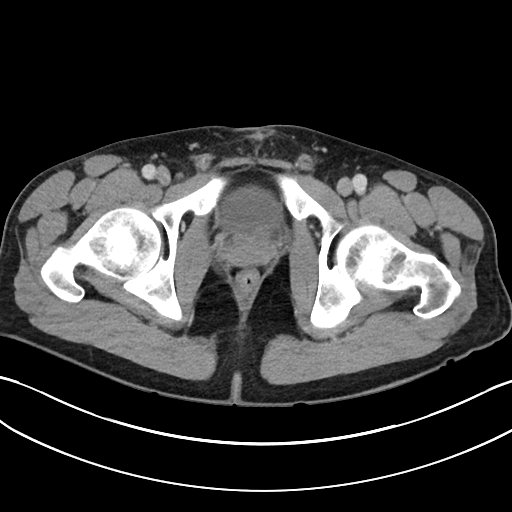
[im 35/111  soft-tissue]
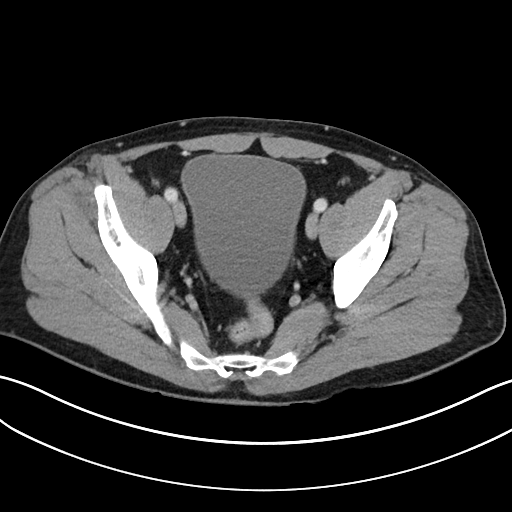
[im 42/111  soft-tissue]
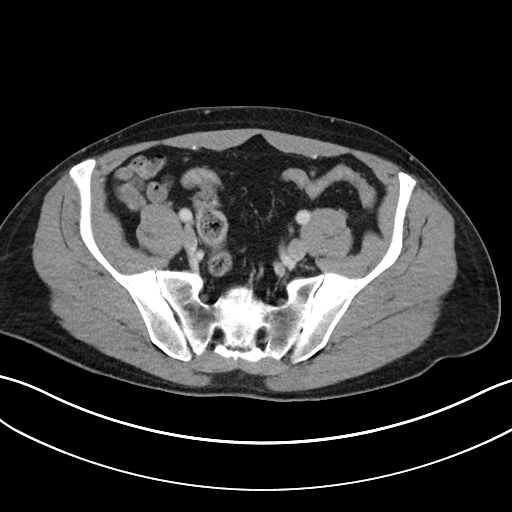
[im 49/111  soft-tissue]
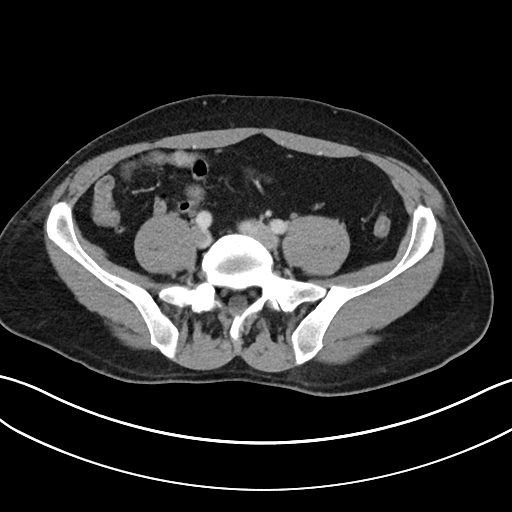
[im 56/111  soft-tissue]
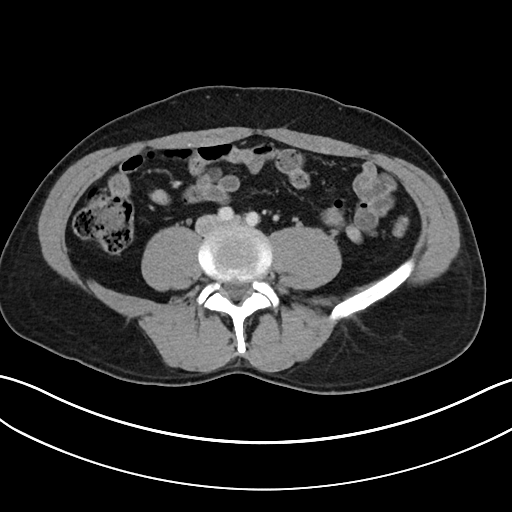
[im 62/111  soft-tissue]
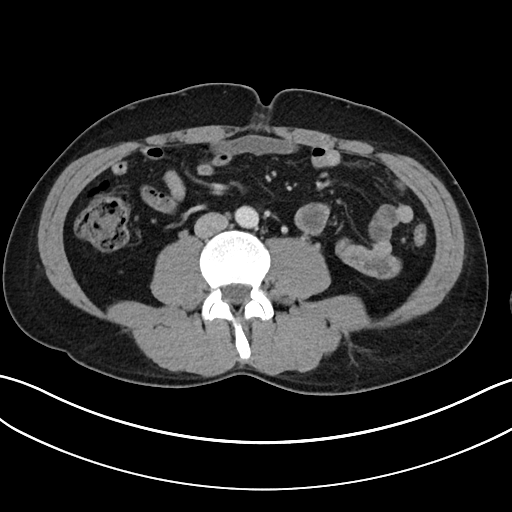
[im 69/111  soft-tissue]
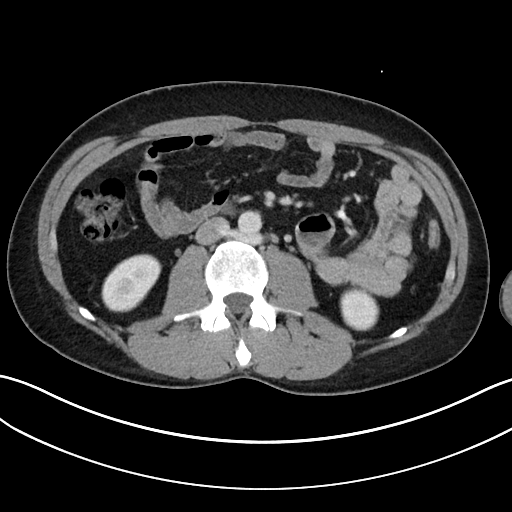
[im 69/111  bone]
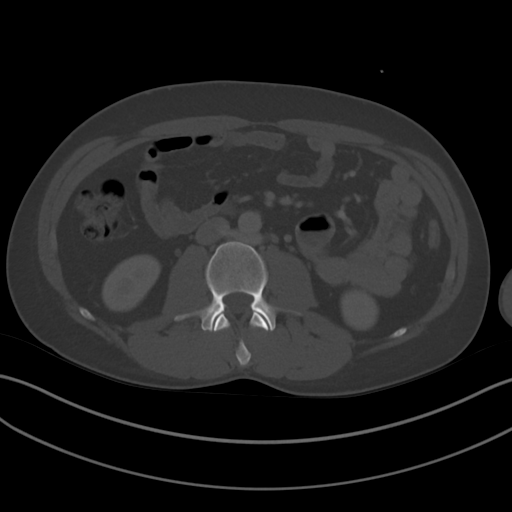
[im 76/111  soft-tissue]
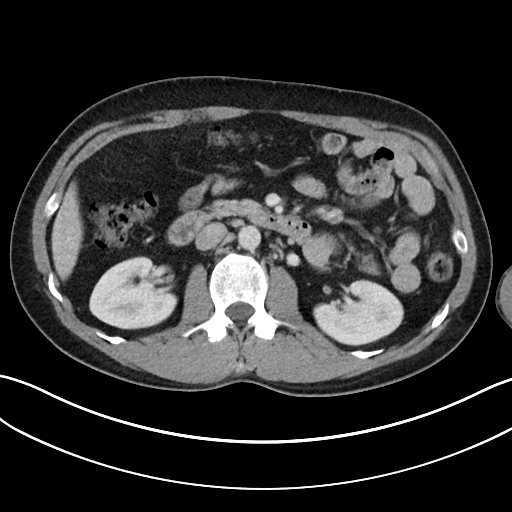
[im 90/111  soft-tissue]
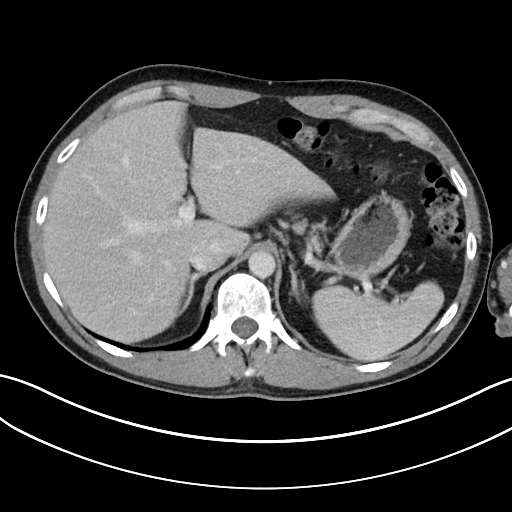
[im 97/111  soft-tissue]
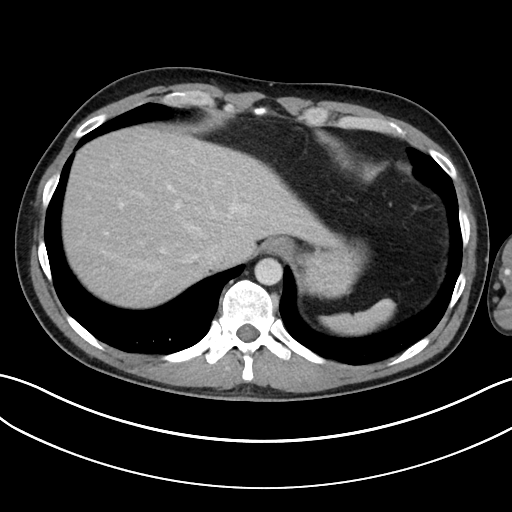
[im 104/111  soft-tissue]
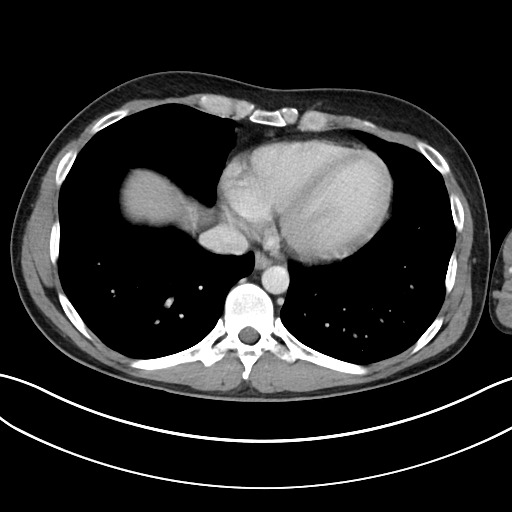

[Series 6: abdomen 3.0 mpr cor · coronal · 0.82mm/px · 3 of 71 slices shown]
[im 24/71  soft-tissue]
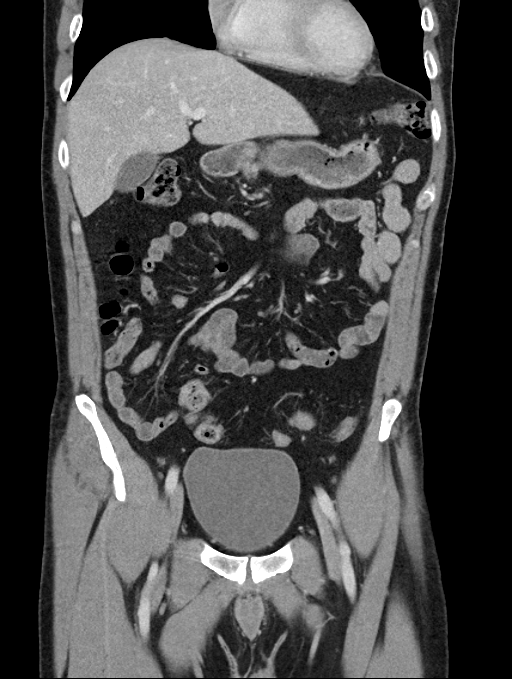
[im 32/71  soft-tissue]
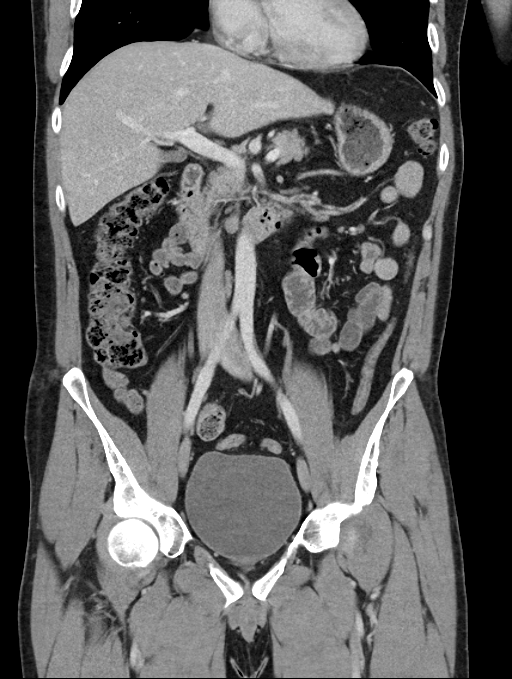
[im 39/71  soft-tissue]
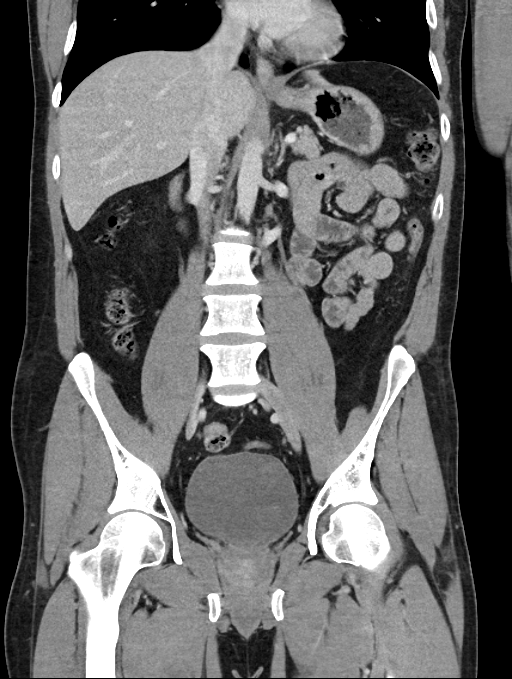

[16 of 46 positions shown; findings below may reference images not displayed]

FINDINGS: Lower chest: Normal

Hepatobiliary: Normal

Pancreas: Normal

Spleen: Normal

Adrenals/Urinary Tract: Adrenal glands are normal. Kidneys are
normal. No evidence of bladder injury.

Stomach/Bowel: Normal

Vascular/Lymphatic: Normal

Reproductive: Normal

Other: No free fluid or air.

Musculoskeletal: No evidence fracture or dislocation. Specific
attention to the pelvis and right hip does not show any evidence of
injury.
IMPRESSION: Normal CT scan of the abdomen and pelvis. No traumatic finding. No
cause of right hip region pain identified.

## 2018-05-27 IMAGING — CR DG WRIST COMPLETE 3+V*L*
3 series · 3 of 3 positions shown · non-contrast
Comparison: None.

CLINICAL DATA: Thrown from horse and CT.

EXAM:
LEFT WRIST - COMPLETE 3+ VIEW

[wrist pa]
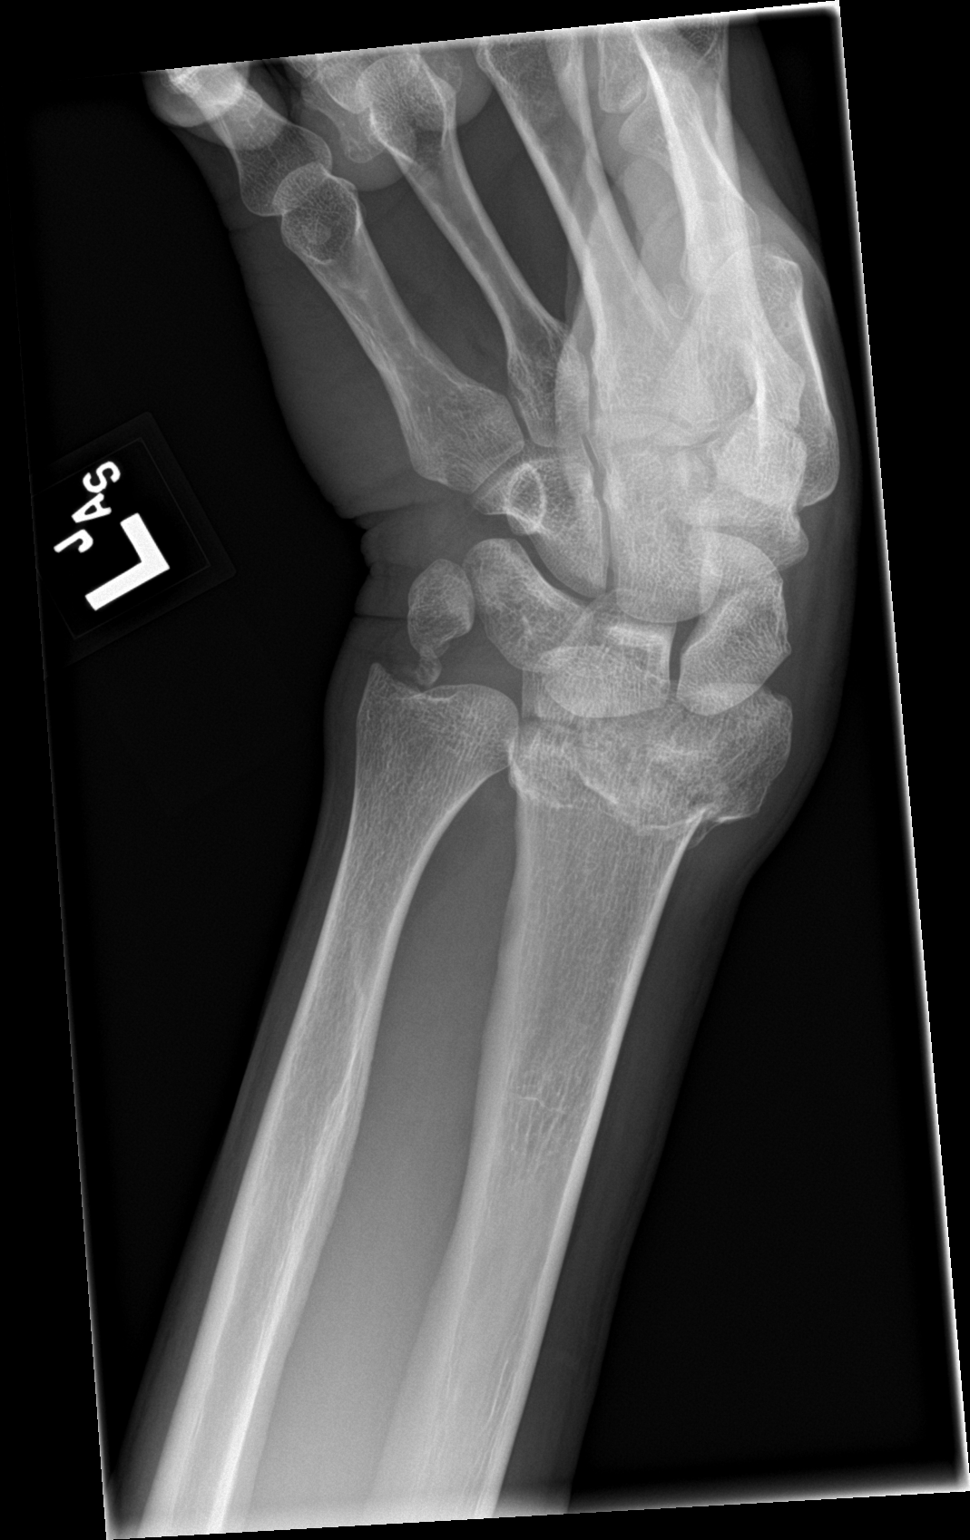

[wrist obl]
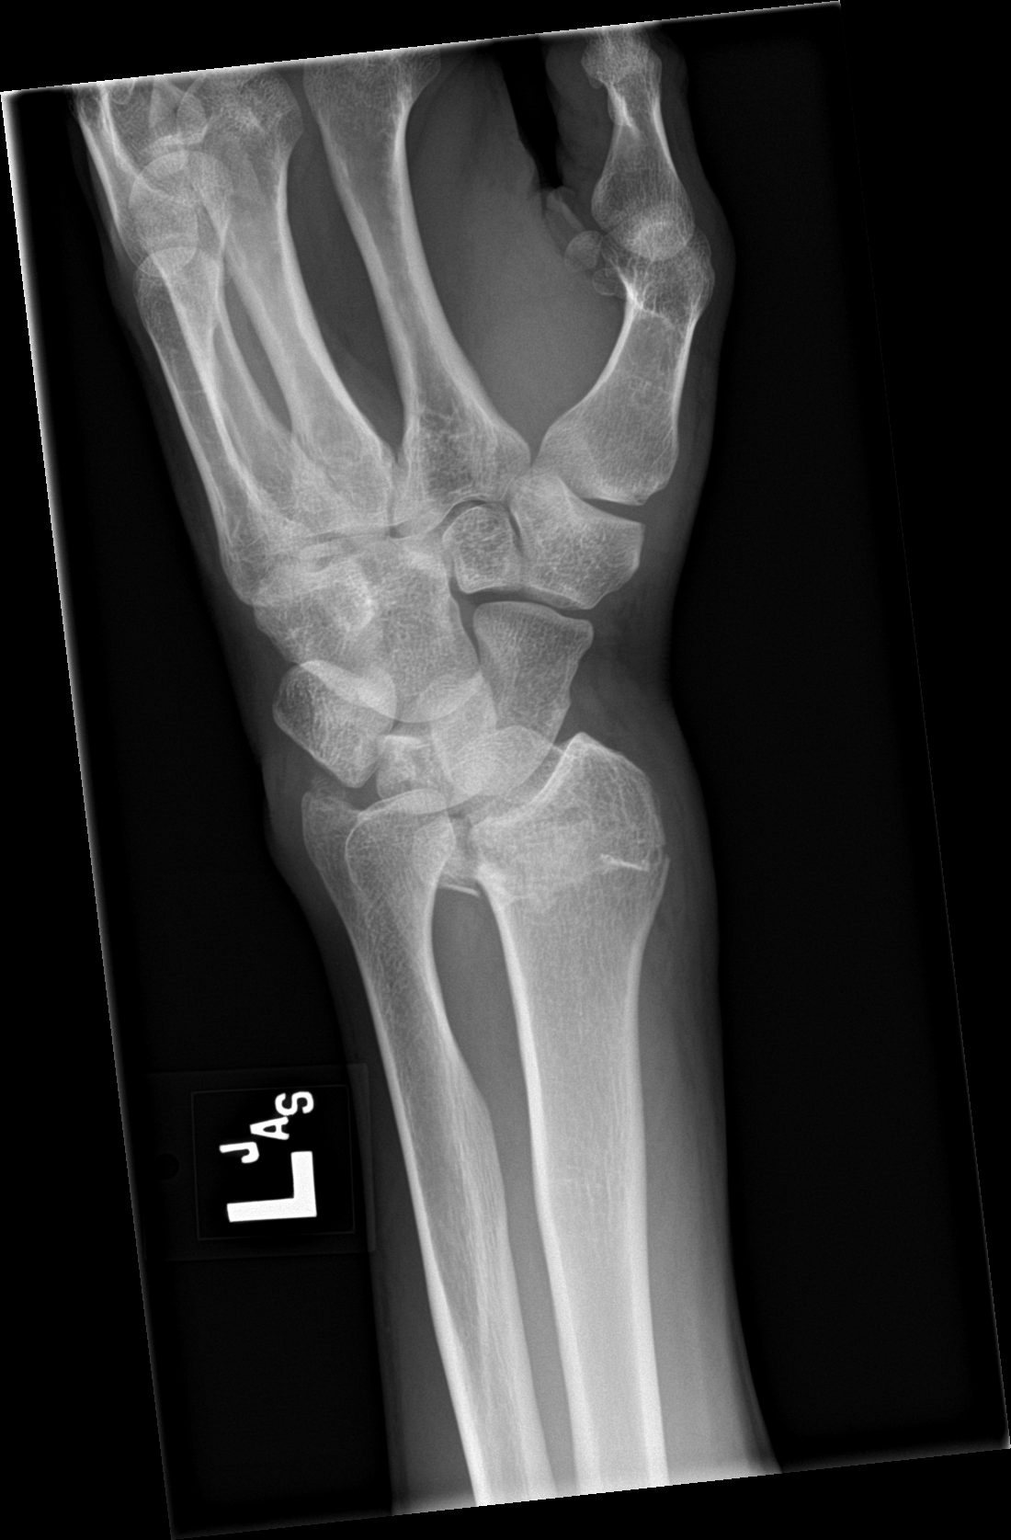

[wrist lat]
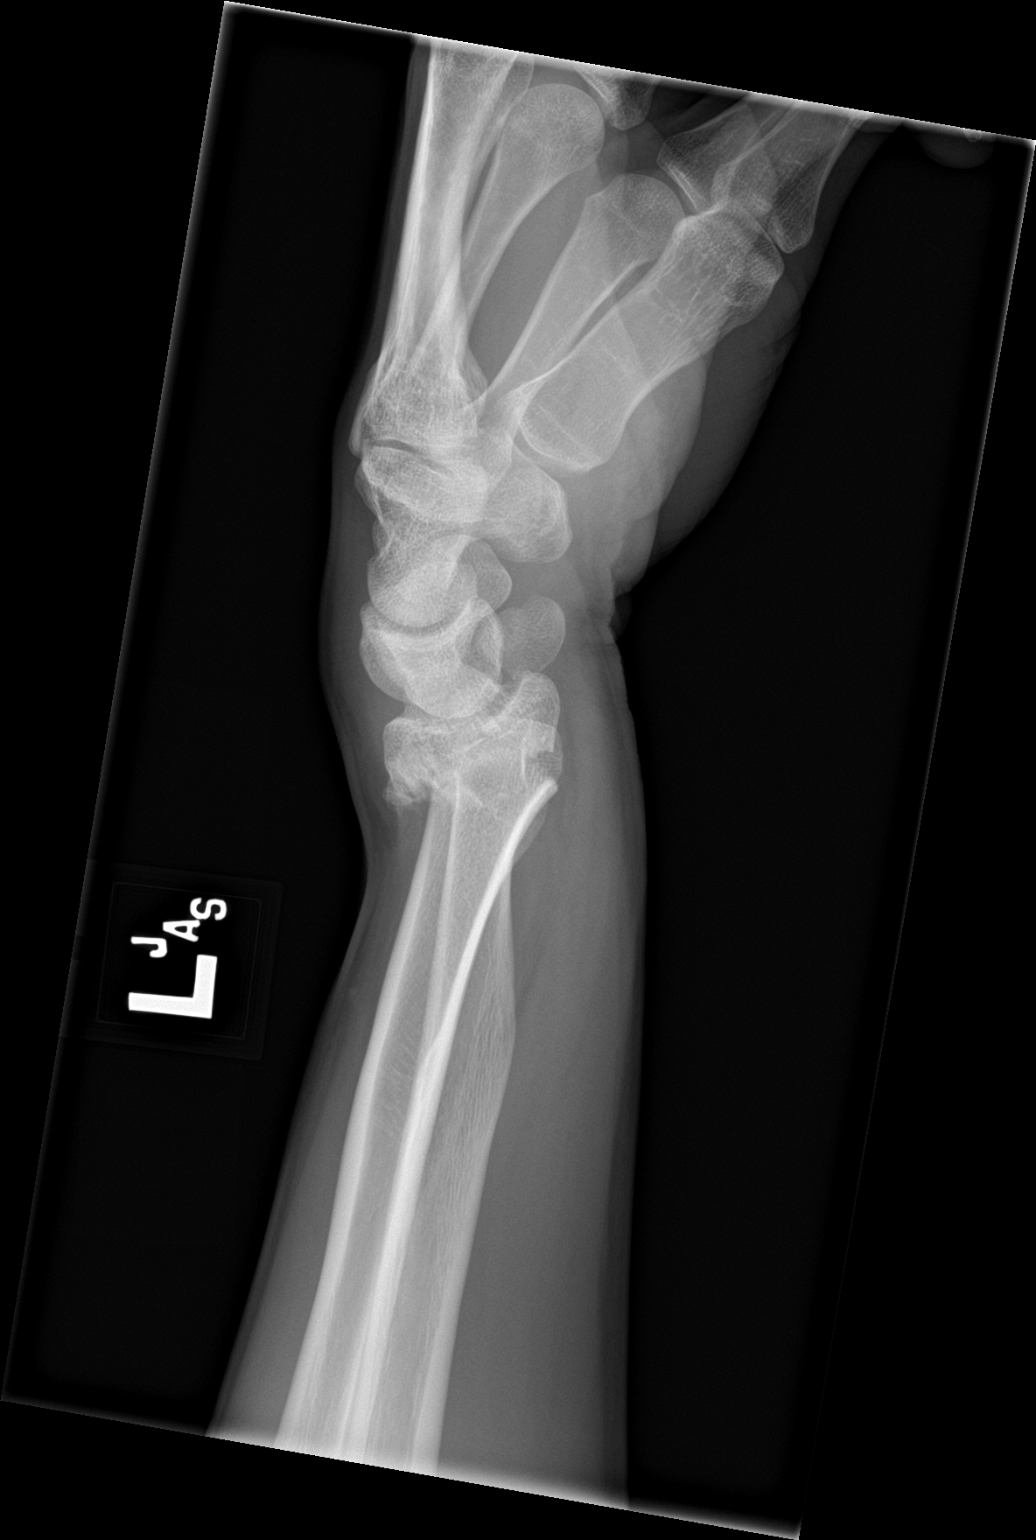

[3 of 3 positions shown; findings below may reference images not displayed]

FINDINGS: There is a comminuted intra-articular distal radius fracture with
impaction and angulation. Ulnar styloid fracture. No dislocation.
IMPRESSION: Fractures of the distal radius and ulnar styloid.
